# Patient Record
Sex: Female | Born: 1965 | Race: White | Hispanic: No | Marital: Single | Smoking: Former smoker
Health system: Southern US, Community
[De-identification: ages and names within clinical notes are randomized; demographics above are authoritative.]

## PROBLEM LIST (undated history)

## (undated) DIAGNOSIS — F32A Depression, unspecified: Secondary | ICD-10-CM

## (undated) DIAGNOSIS — I10 Essential (primary) hypertension: Secondary | ICD-10-CM

## (undated) DIAGNOSIS — F329 Major depressive disorder, single episode, unspecified: Secondary | ICD-10-CM

## (undated) DIAGNOSIS — F419 Anxiety disorder, unspecified: Secondary | ICD-10-CM

## (undated) DIAGNOSIS — G43909 Migraine, unspecified, not intractable, without status migrainosus: Secondary | ICD-10-CM

## (undated) DIAGNOSIS — R569 Unspecified convulsions: Secondary | ICD-10-CM

## (undated) HISTORY — DX: Anxiety disorder, unspecified: F41.9

## (undated) HISTORY — PX: INCISION AND DRAINAGE: SHX5863

## (undated) HISTORY — PX: EXAM UNDER ANESTHESIA WITH MANIPULATION OF SHOULDER: SHX5817

## (undated) HISTORY — DX: Depression, unspecified: F32.A

## (undated) HISTORY — DX: Migraine, unspecified, not intractable, without status migrainosus: G43.909

## (undated) HISTORY — PX: STAPEDES SURGERY: SHX789

## (undated) HISTORY — DX: Major depressive disorder, single episode, unspecified: F32.9

---

## 1999-03-13 ENCOUNTER — Emergency Department (HOSPITAL_COMMUNITY): Admission: EM | Admit: 1999-03-13 | Discharge: 1999-03-14 | Payer: Self-pay | Admitting: Emergency Medicine

## 1999-04-07 ENCOUNTER — Emergency Department (HOSPITAL_COMMUNITY): Admission: EM | Admit: 1999-04-07 | Discharge: 1999-04-07 | Payer: Self-pay | Admitting: *Deleted

## 1999-05-07 ENCOUNTER — Emergency Department (HOSPITAL_COMMUNITY): Admission: EM | Admit: 1999-05-07 | Discharge: 1999-05-07 | Payer: Self-pay | Admitting: Emergency Medicine

## 1999-10-06 ENCOUNTER — Emergency Department (HOSPITAL_COMMUNITY): Admission: EM | Admit: 1999-10-06 | Discharge: 1999-10-06 | Payer: Self-pay | Admitting: Internal Medicine

## 2000-01-05 ENCOUNTER — Encounter: Payer: Self-pay | Admitting: Family Medicine

## 2000-01-05 ENCOUNTER — Encounter: Admission: RE | Admit: 2000-01-05 | Discharge: 2000-01-05 | Payer: Self-pay | Admitting: Family Medicine

## 2012-09-15 DIAGNOSIS — E785 Hyperlipidemia, unspecified: Secondary | ICD-10-CM | POA: Insufficient documentation

## 2013-04-05 DIAGNOSIS — R4189 Other symptoms and signs involving cognitive functions and awareness: Secondary | ICD-10-CM | POA: Insufficient documentation

## 2013-04-05 DIAGNOSIS — I951 Orthostatic hypotension: Secondary | ICD-10-CM | POA: Insufficient documentation

## 2013-10-03 DIAGNOSIS — E669 Obesity, unspecified: Secondary | ICD-10-CM | POA: Insufficient documentation

## 2015-10-14 DIAGNOSIS — R51 Headache: Secondary | ICD-10-CM

## 2015-10-14 DIAGNOSIS — R519 Headache, unspecified: Secondary | ICD-10-CM | POA: Insufficient documentation

## 2015-12-13 ENCOUNTER — Ambulatory Visit (INDEPENDENT_AMBULATORY_CARE_PROVIDER_SITE_OTHER): Payer: BLUE CROSS/BLUE SHIELD | Admitting: Physician Assistant

## 2015-12-13 VITALS — BP 180/84 | HR 68 | Temp 97.8°F | Resp 17 | Ht 62.0 in | Wt 175.0 lb

## 2015-12-13 DIAGNOSIS — H9201 Otalgia, right ear: Secondary | ICD-10-CM

## 2015-12-13 DIAGNOSIS — M5430 Sciatica, unspecified side: Secondary | ICD-10-CM | POA: Insufficient documentation

## 2015-12-13 DIAGNOSIS — Z23 Encounter for immunization: Secondary | ICD-10-CM | POA: Diagnosis not present

## 2015-12-13 DIAGNOSIS — G40909 Epilepsy, unspecified, not intractable, without status epilepticus: Secondary | ICD-10-CM | POA: Insufficient documentation

## 2015-12-13 DIAGNOSIS — G43909 Migraine, unspecified, not intractable, without status migrainosus: Secondary | ICD-10-CM | POA: Insufficient documentation

## 2015-12-13 DIAGNOSIS — H6981 Other specified disorders of Eustachian tube, right ear: Secondary | ICD-10-CM

## 2015-12-13 DIAGNOSIS — F419 Anxiety disorder, unspecified: Secondary | ICD-10-CM | POA: Insufficient documentation

## 2015-12-13 DIAGNOSIS — R591 Generalized enlarged lymph nodes: Secondary | ICD-10-CM

## 2015-12-13 DIAGNOSIS — I10 Essential (primary) hypertension: Secondary | ICD-10-CM | POA: Insufficient documentation

## 2015-12-13 NOTE — Patient Instructions (Signed)
     IF you received an x-ray today, you will receive an invoice from [facility]. Please contact [facility] at [phone] with questions or concerns regarding your invoice.   IF you received labwork today, you will receive an invoice from [facility]. Please contact [facility] at [phone] with questions or concerns regarding your invoice.   Our billing staff will not be able to assist you with questions regarding bills from these companies.  You will be contacted with the lab results as soon as they are available. The fastest way to get your results is to activate your My Chart account. Instructions are located on the last page of this paperwork. If you have not heard from us regarding the results in 2 weeks, please contact this office.      

## 2015-12-13 NOTE — Progress Notes (Signed)
   Jacqueline Pratt  MRN: [id number] DOB: [birth year]  Subjective:  Pt presents to clinic with right ear problems - pain is sharp and deep inside her ear and radiates into her right neck - she is having no hearing problems - she has had no change in her tinnitus or vertigo - she has terrible itching in her ear canal - she came into the clinic because she has had problems with her ears in the past and she does not want to wait.  She has no current cold symptoms.  A shower helps with the pain.  Review of Systems  Constitutional: Negative for chills and fever.  HENT: Positive for ear pain (right). Negative for congestion, ear discharge, rhinorrhea and sore throat.     Patient Active Problem List   Diagnosis Date Noted  . HTN (hypertension) 12/13/2015  . Migraines 12/13/2015  . Sciatic leg pain 12/13/2015  . Anxiety 12/13/2015  . Seizure disorder ([place]) 12/13/2015    No current outpatient prescriptions on file prior to visit.   No current facility-administered medications on file prior to visit.     Allergies  Allergen Reactions  . Statins Other (See Comments)    Diarrhea   . Tetracyclines & Related Nausea Only    Pt patients past, family and social history were reviewed and updated.  Objective:  BP (!) 180/84 (BP Location: Right Arm, Patient Position: Sitting, Cuff Size: Normal)   Pulse 68   Temp 97.8 F (36.6 C) (Oral)   Resp 17   Ht 5\' 2"  (1.575 m)   Wt 175 lb (79.4 kg)   LMP 10/13/2015   SpO2 99%   BMI 32.01 kg/m   Physical Exam  Constitutional: She is oriented to person, place, and time and well-developed, well-nourished, and in no distress.  HENT:  Head: Normocephalic and atraumatic.  Right Ear: Hearing, external ear and ear canal normal. Tympanic membrane is scarred (posterior aspect) and bulging. A middle ear effusion (clear) is present.  Left Ear: Hearing, tympanic membrane, external ear and ear canal normal.  Nose: Nose normal.  Mouth/Throat: Uvula is  midline, oropharynx is clear and moist and mucous membranes are normal.  Eyes: Conjunctivae are normal.  Neck: Normal range of motion.  Cardiovascular: Normal rate, regular rhythm and normal heart sounds.   No murmur heard. Pulmonary/Chest: Effort normal and breath sounds normal.  Lymphadenopathy:       Head (right side): Tonsillar (tender) adenopathy present.       Head (left side): No tonsillar adenopathy present.  Neurological: She is alert and oriented to person, place, and time. Gait normal.  Skin: Skin is warm and dry.  Psychiatric: Mood, memory, affect and judgment normal.  Vitals reviewed.   Assessment and Plan :  Right ear pain  Need for prophylactic vaccination and inoculation against influenza - Plan: Flu Vaccine QUAD 36+ mos IM  Dysfunction of right eustachian tube  Lymphadenopathy   Pt's pain is likely multifactorial from the ETD and the swollen lymph node.  She has flonase at home that she will start using - she will use tylenol/motrin to help with the lymph node pain - if cold symptoms start she will start mucinex and other OTC medications to help with those symptoms.  Her questions were answered and she agrees with the plan.  Windell Hummingbird PA-C  [facility] 12/14/2015 8:07 AM

## 2016-01-08 ENCOUNTER — Ambulatory Visit: Payer: BLUE CROSS/BLUE SHIELD

## 2016-01-09 ENCOUNTER — Ambulatory Visit (INDEPENDENT_AMBULATORY_CARE_PROVIDER_SITE_OTHER): Payer: BLUE CROSS/BLUE SHIELD

## 2016-01-09 ENCOUNTER — Ambulatory Visit (INDEPENDENT_AMBULATORY_CARE_PROVIDER_SITE_OTHER): Payer: BLUE CROSS/BLUE SHIELD | Admitting: Physician Assistant

## 2016-01-09 ENCOUNTER — Encounter: Payer: Self-pay | Admitting: Physician Assistant

## 2016-01-09 VITALS — BP 118/72 | HR 74 | Temp 98.4°F | Resp 18 | Ht 62.0 in | Wt 184.0 lb

## 2016-01-09 DIAGNOSIS — S161XXA Strain of muscle, fascia and tendon at neck level, initial encounter: Secondary | ICD-10-CM | POA: Diagnosis not present

## 2016-01-09 DIAGNOSIS — M542 Cervicalgia: Secondary | ICD-10-CM

## 2016-01-09 MED ORDER — MELOXICAM 7.5 MG PO TABS: 7.5000 mg | ORAL_TABLET | Freq: Every day | ORAL | 1 refills | Status: DC

## 2016-01-09 MED ORDER — CYCLOBENZAPRINE HCL 10 MG PO TABS: 10.0000 mg | ORAL_TABLET | Freq: Three times a day (TID) | ORAL | 0 refills | Status: DC | PRN

## 2016-01-09 NOTE — Patient Instructions (Addendum)
There is no fracture of your neck. You have a muscle strain that will heal with rehab - including stretching and exercises. Patient is to perform exercises below at 5 sets with 10 repetitions. Stretches are to be performed for 5 sets, 10 seconds each. Recommended she perform this rehab twice daily within pain tolerance for 2 weeks.  Meloxicam is an antiinflammatory - you may take up to two a day as you see fit.    IF you received an x-ray today, you will receive an invoice from [facility]. Please contact [facility] at [phone] with questions or concerns regarding your invoice.   IF you received labwork today, you will receive an invoice from [employer]. Please contact [facility] at [phone] with questions or concerns regarding your invoice.   Our billing staff will not be able to assist you with questions regarding bills from these companies.  You will be contacted with the lab results as soon as they are available. The fastest way to get your results is to activate your My Chart account. Instructions are located on the last page of this paperwork. If you have not heard from [place] regarding the results in 2 weeks, please contact this office.     Cervical Strain and Sprain Rehab Ask your health care provider which exercises are safe for you. Do exercises exactly as told by your health care provider and adjust them as directed. It is normal to feel mild stretching, pulling, tightness, or discomfort as you do these exercises, but you should stop right away if you feel sudden pain or your pain gets worse.Do not begin these exercises until told by your health care provider. Stretching and range of motion exercises These exercises warm up your muscles and joints and improve the movement and flexibility of your neck. These exercises also help to relieve pain, numbness, and tingling. Exercise A: Cervical side bend 1. Using good posture, sit on a stable  chair or stand up. 2. Without moving your shoulders, slowly tilt your left / right ear to your shoulder until you feel a stretch in your neck muscles. You should be looking straight ahead. 3. Hold for __________ seconds. 4. Repeat with the other side of your neck. Repeat __________ times. Complete this exercise __________ times a day. Exercise B: Cervical rotation 1. Using good posture, sit on a stable chair or stand up. 2. Slowly turn your head to the side as if you are looking over your left / right shoulder.  Keep your eyes level with the ground.  Stop when you feel a stretch along the side and the back of your neck. 3. Hold for __________ seconds. 4. Repeat this by turning to your other side. Repeat __________ times. Complete this exercise __________ times a day. Exercise C: Thoracic extension and pectoral stretch 1. Roll a towel or a small blanket so it is about 4 inches (10 cm) in diameter. 2. Lie down on your back on a firm surface. 3. Put the towel lengthwise, under your spine in the middle of your back. It should not be not under your shoulder blades. The towel should line up with your spine from your middle back to your lower back. 4. Put your hands behind your head and let your elbows fall out to your sides. 5. Hold for __________ seconds. Repeat __________ times. Complete this exercise __________ times a day. Strengthening exercises These exercises build strength and endurance in your neck. Endurance is the ability to use your muscles  for a long time, even after your muscles get tired. Exercise D: Upper cervical flexion, isometric 1. Lie on your back with a thin pillow behind your head and a small rolled-up towel under your neck. 2. Gently tuck your chin toward your chest and nod your head down to look toward your feet. Do not lift your head off the pillow. 3. Hold for __________ seconds. 4. Release the tension slowly. Relax your neck muscles completely before you repeat this  exercise. Repeat __________ times. Complete this exercise __________ times a day. Exercise E: Cervical extension, isometric 1. Stand about 6 inches (15 cm) away from a wall, with your back facing the wall. 2. Place a soft object, about 6-8 inches (15-20 cm) in diameter, between the back of your head and the wall. A soft object could be a small pillow, a ball, or a folded towel. 3. Gently tilt your head back and press into the soft object. Keep your jaw and forehead relaxed. 4. Hold for __________ seconds. 5. Release the tension slowly. Relax your neck muscles completely before you repeat this exercise. Repeat __________ times. Complete this exercise __________ times a day. Posture and body mechanics   Body mechanics refers to the movements and positions of your body while you do your daily activities. Posture is part of body mechanics. Good posture and healthy body mechanics can help to relieve stress in your body's tissues and joints. Good posture means that your spine is in its natural S-curve position (your spine is neutral), your shoulders are pulled back slightly, and your head is not tipped forward. The following are general guidelines for applying improved posture and body mechanics to your everyday activities. Standing  When standing, keep your spine neutral and keep your feet about hip-width apart. Keep a slight bend in your knees. Your ears, shoulders, and hips should line up.  When you do a task in which you stand in one place for a long time, place one foot up on a stable object that is 2-4 inches (5-10 cm) high, such as a footstool. This helps keep your spine neutral. Sitting  When sitting, keep your spine neutral and your keep feet flat on the floor. Use a footrest, if necessary, and keep your thighs parallel to the floor. Avoid rounding your shoulders, and avoid tilting your head forward.  When working at a desk or a computer, keep your desk at a height where your hands are  slightly lower than your elbows. Slide your chair under your desk so you are close enough to maintain good posture.  When working at a computer, place your monitor at a height where you are looking straight ahead and you do not have to tilt your head forward or downward to look at the screen. Resting When lying down and resting, avoid positions that are most painful for you. Try to support your neck in a neutral position. You can use a contour pillow or a small rolled-up towel. Your pillow should support your neck but not push on it. This information is not intended to replace advice given to you by your health care provider. Make sure you discuss any questions you have with your health care provider. Document Released: 02/09/2005 Document Revised: 10/17/2015 Document Reviewed: 01/16/2015 Elsevier Interactive Patient Education  2017 [employer].
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
## 2016-02-04 ENCOUNTER — Other Ambulatory Visit: Payer: Self-pay

## 2016-02-04 DIAGNOSIS — M542 Cervicalgia: Secondary | ICD-10-CM

## 2016-02-04 MED ORDER — MELOXICAM 7.5 MG PO TABS: 7.5000 mg | ORAL_TABLET | Freq: Every day | ORAL | 0 refills | Status: DC

## 2016-02-04 NOTE — Telephone Encounter (Signed)
[facility] [address] Request Meloxicam req 90 day supply  Sent without refills.

## 2016-03-03 ENCOUNTER — Emergency Department (HOSPITAL_COMMUNITY)
Admission: EM | Admit: 2016-03-03 | Discharge: 2016-03-03 | Disposition: A | Discharge status: Home / Self Care | Payer: BLUE CROSS/BLUE SHIELD | Attending: Emergency Medicine | Admitting: Emergency Medicine

## 2016-03-03 ENCOUNTER — Ambulatory Visit (INDEPENDENT_AMBULATORY_CARE_PROVIDER_SITE_OTHER): Payer: BLUE CROSS/BLUE SHIELD | Admitting: Physician Assistant

## 2016-03-03 ENCOUNTER — Emergency Department (HOSPITAL_COMMUNITY): Payer: BLUE CROSS/BLUE SHIELD

## 2016-03-03 ENCOUNTER — Ambulatory Visit (INDEPENDENT_AMBULATORY_CARE_PROVIDER_SITE_OTHER): Payer: BLUE CROSS/BLUE SHIELD

## 2016-03-03 ENCOUNTER — Ambulatory Visit: Payer: BLUE CROSS/BLUE SHIELD

## 2016-03-03 ENCOUNTER — Encounter (HOSPITAL_COMMUNITY): Payer: Self-pay | Admitting: Emergency Medicine

## 2016-03-03 VITALS — BP 130/80 | HR 62 | Temp 97.7°F | Resp 17 | Ht 63.0 in | Wt 186.0 lb

## 2016-03-03 DIAGNOSIS — M25511 Pain in right shoulder: Secondary | ICD-10-CM | POA: Diagnosis not present

## 2016-03-03 DIAGNOSIS — W1830XA Fall on same level, unspecified, initial encounter: Secondary | ICD-10-CM | POA: Insufficient documentation

## 2016-03-03 DIAGNOSIS — S4991XA Unspecified injury of right shoulder and upper arm, initial encounter: Secondary | ICD-10-CM | POA: Diagnosis present

## 2016-03-03 DIAGNOSIS — Y939 Activity, unspecified: Secondary | ICD-10-CM | POA: Insufficient documentation

## 2016-03-03 DIAGNOSIS — S43014A Anterior dislocation of right humerus, initial encounter: Secondary | ICD-10-CM | POA: Insufficient documentation

## 2016-03-03 DIAGNOSIS — Y999 Unspecified external cause status: Secondary | ICD-10-CM | POA: Insufficient documentation

## 2016-03-03 DIAGNOSIS — I10 Essential (primary) hypertension: Secondary | ICD-10-CM | POA: Insufficient documentation

## 2016-03-03 DIAGNOSIS — S43004A Unspecified dislocation of right shoulder joint, initial encounter: Secondary | ICD-10-CM

## 2016-03-03 DIAGNOSIS — Y929 Unspecified place or not applicable: Secondary | ICD-10-CM | POA: Insufficient documentation

## 2016-03-03 HISTORY — DX: Unspecified convulsions: R56.9

## 2016-03-03 HISTORY — DX: Essential (primary) hypertension: I10

## 2016-03-03 MED ORDER — FENTANYL CITRATE (PF) 100 MCG/2ML IJ SOLN
25.0000 ug | Freq: Once | INTRAMUSCULAR | Status: AC
  Administered 2016-03-03: 25 ug via INTRAVENOUS
  Filled 2016-03-03: qty 2

## 2016-03-03 MED ORDER — PROPOFOL 10 MG/ML IV BOLUS
INTRAVENOUS | Status: AC | PRN
  Administered 2016-03-03: 20 mg via INTRAVENOUS
  Administered 2016-03-03: 42.2 mg via INTRAVENOUS
  Administered 2016-03-03: 20 mg via INTRAVENOUS

## 2016-03-03 MED ORDER — PROPOFOL 10 MG/ML IV BOLUS
0.5000 mg/kg | Freq: Once | INTRAVENOUS | Status: AC
Start: 2016-03-03 — End: 2016-03-03
  Administered 2016-03-03: 42.2 mg via INTRAVENOUS
  Filled 2016-03-03: qty 20

## 2016-03-03 NOTE — Progress Notes (Signed)
Attempted to reduce anteriorly displaced shoulder.  Injected shoulder with lidocaine to attempt to reduce pain in joint.    Procedure:  Injection of right shoulder Consent obtained and verified. Time-out conducted. Noted no overlying erythema, induration, or other signs of local infection. Skin prepped in a sterile fashion. Topical analgesic spray: Ethyl chloride. Completed without difficulty. Meds: 6 cc 2% xylocaine Pain immediately improved suggesting accurate placement of the medication. Advised to call if fevers/chills, erythema, induration, drainage, or persistent bleeding.  RUE neurovascularly intact pre and postreduction.   Able to reduce.  Postreduction attempt x-rays performed and confirmed.  Pt sent to ED to be evaluated by orthopedics.

## 2016-03-03 NOTE — ED Triage Notes (Signed)
Pt here with right shoulder dislocation; pt sent here for relocation

## 2016-03-03 NOTE — ED Provider Notes (Signed)
[place] DEPT Provider Note   CSN: [id number] Arrival date & time: 03/03/16  1529  By signing my name below, I, Jacqueline Pratt, attest that this documentation has been prepared under the direction and in the presence of Jacqueline Muskrat, MD. Electronically Signed: Soijett Pratt, ED Scribe. 03/03/16. 4:45 PM.  History   Chief Complaint Chief Complaint  Patient presents with  . Shoulder Injury    HPI Jacqueline Pratt is a 51 y.o. female with a PMHx of HTN, who presents to the Emergency Department complaining of right shoulder injury occurring this morning. Pt notes that she fell prior to the onset of her symptoms. Pt was evaluated at urgent care and was informed to follow up in the ED fr further evaluation. She was given a lidocaine injection while at Urgent Care with relief of her symptoms. She denies CP, LOC, confusion, hip pain, and any other symptoms. Multiple attempts to reduce the shoulder at urgent care were unsuccessful, and she was sent here for evaluation.   The history is provided by the patient. No language interpreter was used.    Past Medical History:  Diagnosis Date  . Hypertension   . Seizures [facility])     Patient Active Problem List   Diagnosis Date Noted  . HTN (hypertension) 12/13/2015  . Migraines 12/13/2015  . Sciatic leg pain 12/13/2015  . Anxiety 12/13/2015  . Seizure disorder ([place]) 12/13/2015    History reviewed. No pertinent surgical history.  OB History    No data available       Home Medications    Prior to Admission medications   Medication Sig Start Date End Date Taking? Authorizing Provider  amLODipine (NORVASC) 5 MG tablet Take 5 mg by mouth daily.    Historical Provider, MD  Cholecalciferol (VITAMIN D PO) Take by mouth daily.    Historical Provider, MD  clonazePAM (KLONOPIN) 0.5 MG tablet Take 0.5 mg by mouth 2 (two) times daily as needed for anxiety (PRN).    Historical Provider, MD  fluticasone (FLONASE) 50 MCG/ACT nasal spray Place  into the nose.    Historical Provider, MD  lamoTRIgine (LAMICTAL) 100 MG tablet Take 100 mg by mouth 2 (two) times daily.    Historical Provider, MD  levonorgestrel (MIRENA) 20 MCG/24HR IUD by Intrauterine route.    Historical Provider, MD  meloxicam (MOBIC) 7.5 MG tablet Take 1 tablet (7.5 mg total) by mouth daily. 02/04/16   Jacqueline Mink McVey, PA-C  Multiple Vitamins-Minerals (MULTIVITAMIN WITH MINERALS) tablet Take 1 tablet by mouth daily.    Historical Provider, MD  Riboflavin 400 MG CAPS Take 400 mg daily 04/08/15   Historical Provider, MD    Family History History reviewed. No pertinent family history.  Social History Social History  Substance Use Topics  . Smoking status: Never Smoker  . Smokeless tobacco: Never Used  . Alcohol use 4.2 oz/week    7 Standard drinks or equivalent per week     Allergies   Statins and Tetracyclines & related   Review of Systems Review of Systems  Constitutional:       Per HPI, otherwise negative  HENT:       Per HPI, otherwise negative  Respiratory:       Per HPI, otherwise negative  Cardiovascular:       Per HPI, otherwise negative  Gastrointestinal: Negative for vomiting.  Endocrine:       Negative aside from HPI  Genitourinary:       Neg aside from HPI  Musculoskeletal:       Per HPI, otherwise negative  Skin: Negative.   Neurological: Negative for syncope.     Physical Exam Updated Vital Signs BP 144/81 (BP Location: Right Arm)   Pulse 66   Temp 98.7 F (37.1 C) (Oral)   Resp 18   SpO2 100%   Physical Exam  Constitutional: She is oriented to person, place, and time. She appears well-developed and well-nourished. No distress.  HENT:  Head: Normocephalic and atraumatic.  Eyes: Conjunctivae and EOM are normal.  Cardiovascular: Normal rate and regular rhythm.   Pulmonary/Chest: Effort normal and breath sounds normal. No stridor. No respiratory distress.  Abdominal: She exhibits no distension.  Musculoskeletal:  She exhibits tenderness and deformity. She exhibits no edema.       Right shoulder: She exhibits decreased range of motion and deformity.  Right shoulder deformity with limited ROM  Neurological: She is alert and oriented to person, place, and time. No cranial nerve deficit.  Skin: Skin is warm and dry.  Psychiatric: She has a normal mood and affect.  Nursing note and vitals reviewed.    ED Treatments / Results  DIAGNOSTIC STUDIES: Oxygen Saturation is 100% on RA, nl by my interpretation.    COORDINATION OF CARE: 4:24 PM Discussed treatment plan with pt at bedside which includes right shoulder reduction, right shoulder xray, pain medication Rx, referral and follow up with orthopedist, and pt agreed to plan.  Radiology Dg Shoulder Right  Result Date: 03/03/2016 CLINICAL DATA:  Post reduction EXAM: RIGHT SHOULDER - 2+ VIEW COMPARISON:  03/03/2016 FINDINGS: Satisfactory shoulder reduction.  Hill-Sachs deformity. IMPRESSION: Satisfactory shoulder reduction. Electronically Signed   By: Franchot Gallo M.D.   On: 03/03/2016 19:10   Dg Shoulder Right  Result Date: 03/03/2016 CLINICAL DATA:  Attempted relocation of anterior dislocation. EXAM: RIGHT SHOULDER - 2+ VIEW COMPARISON:  None. FINDINGS: Anterior shoulder dislocation remains. There is no definite fracture. The visualized hemithorax is clear. The clavicle is unremarkable. IMPRESSION: Persistent anterior shoulder dislocation. Electronically Signed   By: San Morelle M.D.   On: 03/03/2016 14:35   Dg Shoulder Right  Result Date: 03/03/2016 CLINICAL DATA:  Rt Shoulder injury from a fall x this morning. EXAM: RIGHT SHOULDER - 2+ VIEW COMPARISON:  None. FINDINGS: Anterior dislocation of the glenohumeral joint with inferior subluxation. No definite fracture. Normal mineralization. No significant osseous degenerative change. AC joint appears intact. IMPRESSION: Anterior shoulder dislocation Electronically Signed   By: Lucrezia Europe M.D.   On:  03/03/2016 13:00    Procedures Reduction of dislocation Date/Time: 03/03/2016 5:41 PM Performed by: Jacqueline Pratt Authorized by: Jacqueline Pratt  Consent: Verbal consent obtained. Written consent obtained. Risks and benefits: risks, benefits and alternatives were discussed Consent given by: patient Patient understanding: patient states understanding of the procedure being performed Patient consent: the patient's understanding of the procedure matches consent given Procedure consent: procedure consent matches procedure scheduled Relevant documents: relevant documents present and verified Test results: test results available and properly labeled Site marked: the operative site was marked Imaging studies: imaging studies available Required items: required blood products, implants, devices, and special equipment available Patient identity confirmed: verbally with patient, arm band and hospital-assigned identification number Time out: Immediately prior to procedure a "time out" was called to verify the correct patient, procedure, equipment, support staff and site/side marked as required. Preparation: Patient was prepped and draped in the usual sterile fashion. Local anesthesia used: no  Anesthesia: Local anesthesia used: no  Sedation: Patient sedated:  yes Sedatives: propofol (82.2 ml) Analgesia: fentanyl (25 mcg) Sedation start date/time: 03/03/2016 5:41 PM Sedation end date/time: 03/03/2016 5:54 PM Vitals: Vital signs were monitored during sedation. Patient tolerance: Patient tolerated the procedure well with no immediate complications    (including critical care time)  Medications Ordered in ED Medications  propofol (DIPRIVAN) 10 mg/mL bolus/IV push (20 mg Intravenous Given 03/03/16 1747)  propofol (DIPRIVAN) 10 mg/mL bolus/IV push 42.2 mg (42.2 mg Intravenous Given 03/03/16 1717)  fentaNYL (SUBLIMAZE) injection 25 mcg (25 mcg Intravenous Given 03/03/16 1726)     Initial  Impression / Assessment and Plan / ED Course  I have reviewed the triage vital signs and the nursing notes.  Pertinent imaging results that were available during my care of the patient were reviewed by me and considered in my medical decision making (see chart for details).  Clinical Course     On repeat exam, 1930, patient is awake and alert, states that she feels substantially better, has no ongoing complaints. We discussed all findings and I demonstrated the x-rays to her and her mother. Patient will follow up with orthopedics. Given successful reduction of her shoulder dislocation, and no consultations from procedural sedation, nor reduction itself, patient appropriate for discharge with outpatient follow-up.  Final Clinical Impressions(s) / ED Diagnoses   Final diagnoses:  Dislocation of right shoulder joint, initial encounter    I personally performed the services described in this documentation, which was scribed in my presence. The recorded information has been reviewed and is accurate.       Jacqueline Muskrat, MD 03/03/16 [phone]

## 2016-03-03 NOTE — Sedation Documentation (Signed)
Shoulder reduction successful. MD Applied Sling.

## 2016-03-03 NOTE — Patient Instructions (Addendum)
Please go to [facility] ER right now for shoulder relocation.  Thank you for letting me participate in your health and well being.    IF you received an x-ray today, you will receive an invoice from [facility]. Please contact [facility] at [phone] with questions or concerns regarding your invoice.   IF you received labwork today, you will receive an invoice from [facility]. Please contact [facility] at [phone] with questions or concerns regarding your invoice.   Our billing staff will not be able to assist you with questions regarding bills from these companies.  You will be contacted with the lab results as soon as they are available. The fastest way to get your results is to activate your My Chart account. Instructions are located on the last page of this paperwork. If you have not heard from [place] regarding the results in 2 weeks, please contact this office.

## 2016-03-03 NOTE — Progress Notes (Signed)
[name]  MRN: [id number] DOB: 1965-12-17  Subjective:  Jacqueline Pratt is a 51 y.o. female seen in office today for a chief complaint of right shoulder pain x 2 hours prior to arrival. She slipped and fell and hit right shoulder. Notes she can move the arm but it hurts to do so. Denies loss of sensation, numbness, and tingling. Of note, pt has history of broken right arm.   Review of Systems  Constitutional: Negative for chills, diaphoresis, fatigue and fever.  Musculoskeletal: Negative for back pain, gait problem and neck pain.  Neurological: Negative for dizziness, light-headedness and headaches.    Patient Active Problem List   Diagnosis Date Noted  . HTN (hypertension) 12/13/2015  . Migraines 12/13/2015  . Sciatic leg pain 12/13/2015  . Anxiety 12/13/2015  . Seizure disorder ([place]) 12/13/2015    Current Outpatient Prescriptions on File Prior to Visit  Medication Sig Dispense Refill  . amLODipine (NORVASC) 5 MG tablet Take 5 mg by mouth daily.    . Cholecalciferol (VITAMIN D PO) Take by mouth daily.    . clonazePAM (KLONOPIN) 0.5 MG tablet Take 0.5 mg by mouth 2 (two) times daily as needed for anxiety (PRN).    Marland Kitchen lamoTRIgine (LAMICTAL) 100 MG tablet Take 100 mg by mouth 2 (two) times daily.    . meloxicam (MOBIC) 7.5 MG tablet Take 1 tablet (7.5 mg total) by mouth daily. 90 tablet 0  . Multiple Vitamins-Minerals (MULTIVITAMIN WITH MINERALS) tablet Take 1 tablet by mouth daily.     No current facility-administered medications on file prior to visit.     Allergies  Allergen Reactions  . Statins Other (See Comments)    Diarrhea   . Tetracyclines & Related Nausea Only       Social History   Social History  . Marital status: Single    Spouse name: N/A  . Number of children: N/A  . Years of education: N/A   Occupational History  . Not on file.   Social History Main Topics  . Smoking status: Never Smoker  . Smokeless tobacco: Never Used  . Alcohol use  4.2 oz/week    7 Standard drinks or equivalent per week  . Drug use: No  . Sexual activity: No   Other Topics Concern  . Not on file   Social History Narrative  . No narrative on file     Objective:  BP 130/80 (BP Location: Right Arm, Patient Position: Sitting, Cuff Size: Normal)   Pulse 62   Temp 97.7 F (36.5 C) (Oral)   Resp 17   Ht 5\' 3"  (1.6 m)   Wt 186 lb (84.4 kg)   SpO2 98%   BMI 32.95 kg/m   Physical Exam  Constitutional: She is oriented to person, place, and time. She appears distressed (in pain).  HENT:  Head: Normocephalic and atraumatic.  Eyes: Conjunctivae are normal.  Neck: Normal range of motion.  Cardiovascular:  Pulses:      Radial pulses are 2+ on the right side, and 2+ on the left side.  Pulmonary/Chest: Effort normal.  Musculoskeletal:       Right shoulder: She exhibits decreased range of motion, tenderness, bony tenderness (exquisite pain with palpation over anterior aspect of shoulder), swelling, deformity and decreased strength.  Neurological: She is alert and oriented to person, place, and time. Gait normal.  Skin: Skin is dry.  Pt is wearing dark nail polish but can appreciate cap refill at edge of nails bilaterally.  Posterior aspect of right wrist, lower forearm, and elbow are cool to palpation. Pulses intact. No pain or pallor noted.   Psychiatric: Affect normal.  Vitals reviewed.  Dg Shoulder Right  Result Date: 03/03/2016 CLINICAL DATA:  Attempted relocation of anterior dislocation. EXAM: RIGHT SHOULDER - 2+ VIEW COMPARISON:  None. FINDINGS: Anterior shoulder dislocation remains. There is no definite fracture. The visualized hemithorax is clear. The clavicle is unremarkable. IMPRESSION: Persistent anterior shoulder dislocation. Electronically Signed   By: San Morelle M.D.   On: 03/03/2016 14:35   Dg Shoulder Right  Result Date: 03/03/2016 CLINICAL DATA:  Rt Shoulder injury from a fall x this morning. EXAM: RIGHT SHOULDER - 2+  VIEW COMPARISON:  None. FINDINGS: Anterior dislocation of the glenohumeral joint with inferior subluxation. No definite fracture. Normal mineralization. No significant osseous degenerative change. AC joint appears intact. IMPRESSION: Anterior shoulder dislocation Electronically Signed   By: Lucrezia Europe M.D.   On: 03/03/2016 13:00   Multiple attempts of reduction of shoulder dislocation were attempted without success.   Assessment and Plan :  1. Acute pain of right shoulder - DG Shoulder Right; Future - DG Shoulder Right; Future  2. Anterior shoulder dislocation, right, initial encounter -Attempted reduction of shoulder dislocation multiple times without success. Pt instructed to go directly to [facility] ED for further treatment. Pt would like to be transported by her mother who drove her here.  [facility] ED triage nurse, [place], contacted and informed that patient would be arriving soon.    Tenna Delaine PA-C  [facility] 03/03/2016 4:34 PM

## 2016-03-04 ENCOUNTER — Other Ambulatory Visit: Payer: Self-pay | Admitting: Physician Assistant

## 2016-03-04 DIAGNOSIS — M542 Cervicalgia: Secondary | ICD-10-CM

## 2016-03-05 ENCOUNTER — Ambulatory Visit (INDEPENDENT_AMBULATORY_CARE_PROVIDER_SITE_OTHER): Payer: BLUE CROSS/BLUE SHIELD | Admitting: Orthopedic Surgery

## 2016-03-05 ENCOUNTER — Encounter (INDEPENDENT_AMBULATORY_CARE_PROVIDER_SITE_OTHER): Payer: Self-pay | Admitting: Orthopedic Surgery

## 2016-03-05 DIAGNOSIS — S43014A Anterior dislocation of right humerus, initial encounter: Secondary | ICD-10-CM | POA: Diagnosis not present

## 2016-03-05 NOTE — Progress Notes (Signed)
Office Visit Note   Patient: Jacqueline Pratt           Date of Birth: 1965-08-02           MRN: [id number] Visit Date: 03/05/2016 Requested by: No referring provider defined for this encounter. PCP: No PCP Per Patient  Subjective: Chief Complaint  Patient presents with  . Right Shoulder - Pain, Injury, Dislocation    HPI Kerins a 51 year old female who dislocated her shoulder 2 days ago.  She required reduction with sedation in the emergency room.  Dislocated 1 time after it was relocated and then had to be relocated again.  She is right-hand dominant.  She works at a call center.  She enjoys doing yoga.              Review of Systems All systems reviewed are negative as they relate to the chief complaint within the history of present illness.  Patient denies  fevers or chills.    Assessment & Plan: Visit Diagnoses:  1. Dislocation, shoulder, anterior, right, initial encounter     Plan: Impression is right shoulder dislocation with no evidence of rotator cuff tear and good deltoid strength.  Plan rehabilitation exercises plus sling for a week plus no overhead motion for 4 weeks total.  Recheck clinically in 4 weeks.  At this point her chance of redislocation is on the order of 20-25%.  I think doing some cuff strengthening exercises would be helpful.  We'll see her back in 4 weeks to decide than for against MRI scanning.  May need to consider surgical stabilization if she redislocates the shoulder again.  Follow-Up Instructions: Return in about 4 weeks (around 04/02/2016).   Orders:  No orders of the defined types were placed in this encounter.  No orders of the defined types were placed in this encounter.     Procedures: No procedures performed   Clinical Data: No additional findings.  Objective: Vital Signs: There were no vitals taken for this visit.  Physical Exam   Constitutional: Patient appears well-developed HEENT:  Head: Normocephalic Eyes:EOM show  strabismus Neck: Normal range of motion Cardiovascular: Normal rate Pulmonary/chest: Effort normal Neurologic: Patient is alert Skin: Skin is warm Psychiatric: Patient has normal mood and affect    Ortho Exam examination of the right shoulder demonstrates good rotator cuff strength isolated and status x-rays subscap testing on the right.  Deltoid does fire.  I will detect much in the way of course grinding or crepitus with passive range of motion of the shoulder.  I did not take the shoulder through a large range of motion because of her history.  Specialty Comments:  No specialty comments available.  Imaging: No results found.   PMFS History: Patient Active Problem List   Diagnosis Date Noted  . Dislocation, shoulder, anterior, right, initial encounter 03/05/2016  . HTN (hypertension) 12/13/2015  . Migraines 12/13/2015  . Sciatic leg pain 12/13/2015  . Anxiety 12/13/2015  . Seizure disorder ([place]) 12/13/2015   Past Medical History:  Diagnosis Date  . Hypertension   . Seizures ([place])     No family history on file.  No past surgical history on file. Social History   Occupational History  . Not on file.   Social History Main Topics  . Smoking status: Never Smoker  . Smokeless tobacco: Never Used  . Alcohol use 4.2 oz/week    7 Standard drinks or equivalent per week  . Drug use: No  . Sexual activity: No

## 2016-04-10 ENCOUNTER — Telehealth (INDEPENDENT_AMBULATORY_CARE_PROVIDER_SITE_OTHER): Payer: Self-pay | Admitting: Orthopedic Surgery

## 2016-04-10 ENCOUNTER — Other Ambulatory Visit (INDEPENDENT_AMBULATORY_CARE_PROVIDER_SITE_OTHER): Payer: Self-pay | Admitting: Family

## 2016-04-10 MED ORDER — CYCLOBENZAPRINE HCL 10 MG PO TABS: 10.0000 mg | ORAL_TABLET | Freq: Three times a day (TID) | ORAL | 0 refills | Status: AC | PRN

## 2016-04-10 NOTE — Telephone Encounter (Signed)
Rx request Marlou Sa pt

## 2016-04-10 NOTE — Telephone Encounter (Signed)
Sent in an rx for flexeril

## 2016-04-10 NOTE — Telephone Encounter (Signed)
Pt requests refill flexeril for dislocated shoulder, today.  [phone]

## 2016-04-10 NOTE — Progress Notes (Unsigned)
fles

## 2016-04-13 ENCOUNTER — Ambulatory Visit (INDEPENDENT_AMBULATORY_CARE_PROVIDER_SITE_OTHER): Payer: BLUE CROSS/BLUE SHIELD | Admitting: Orthopedic Surgery

## 2016-04-16 ENCOUNTER — Encounter (INDEPENDENT_AMBULATORY_CARE_PROVIDER_SITE_OTHER): Payer: Self-pay | Admitting: Orthopedic Surgery

## 2016-04-16 ENCOUNTER — Ambulatory Visit (INDEPENDENT_AMBULATORY_CARE_PROVIDER_SITE_OTHER): Payer: BLUE CROSS/BLUE SHIELD | Admitting: Orthopedic Surgery

## 2016-04-16 DIAGNOSIS — S43001D Unspecified subluxation of right shoulder joint, subsequent encounter: Secondary | ICD-10-CM

## 2016-04-16 MED ORDER — MELOXICAM 7.5 MG PO TABS: 15.0000 mg | ORAL_TABLET | Freq: Every day | ORAL | 1 refills | Status: DC | PRN

## 2016-04-16 MED ORDER — METHOCARBAMOL 500 MG PO TABS: 500.0000 mg | ORAL_TABLET | Freq: Two times a day (BID) | ORAL | 0 refills | Status: DC | PRN

## 2016-04-16 NOTE — Progress Notes (Signed)
Office Visit Note   Patient: Jacqueline Pratt           Date of Birth: 05-31-1965           MRN: [id number] Visit Date: 04/16/2016 Requested by: No referring provider defined for this encounter. PCP: No PCP Per Patient  Subjective: Chief Complaint  Patient presents with  . Right Shoulder - Pain, Follow-up    HPI Jacqueline Pratt is a 51 year old patient here to follow-up dislocation right shoulder 03/03/2016.  Still has some soreness but in general she's doing well.  Has had no episodes of instability.  Driving her to some.  She has some occasional popping but no real symptomatic mechanical symptoms.  She denies any weakness.  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              Review of Systems All systems reviewed are negative as they relate to the chief complaint within the history of present illness.  Patient denies  fevers or chills.    Assessment & Plan: Visit Diagnoses:  1. Shoulder subluxation, right, subsequent encounter     Plan: Impression is right shoulder dislocation with no evidence of rotator cuff tearing or symptomatic instability.  On her refill her mobile and Flexeril.  Her strength is good.  I'll see her back as needed.  If she has recurrent instability we will proceed with scanning and fixation but at this point I think she is very functional with her shoulder  Follow-Up Instructions: Return if symptoms worsen or fail to improve.   Orders:  No orders of the defined types were placed in this encounter.  No orders of the defined types were placed in this encounter.     Procedures: No procedures performed   Clinical Data: No additional findings.  Objective: Vital Signs: There were no vitals taken for this visit.  Physical Exam   Constitutional: Patient appears well-developed HEENT:  Head: Normocephalic Eyes:EOM are normal Neck: Normal range of motion Cardiovascular: Normal rate Pulmonary/chest: Effort normal Neurologic:  Patient is alert Skin: Skin is warm Psychiatric: Patient has normal mood and affect    Ortho Exam examination of the right shoulder demonstrates excellent rotator cuff strength symmetric to the left-hand side to infraspinatus super space and subscap muscle testing.  Negative apprehension relocation testing.  Negative O'Brien's testing on the right and left.  No acromioclavicular joint tenderness on the right or left hand side.  No other masses lymph adenopathy or skin changes noted in the shoulder region.  No course grinding or crepitus with labral load testing on the right-hand side.  Specialty Comments:  No specialty comments available.  Imaging: No results found.   PMFS History: Patient Active Problem List   Diagnosis Date Noted  . Dislocation, shoulder, anterior, right, initial encounter 03/05/2016  . HTN (hypertension) 12/13/2015  . Migraines 12/13/2015  . Sciatic leg pain 12/13/2015  . Anxiety 12/13/2015  . Seizure disorder ([place]) 12/13/2015   Past Medical History:  Diagnosis Date  . Hypertension   . Seizures ([place])     No family history on file.  No past surgical history on file. Social History   Occupational History  . Not on file.   Social History Main Topics  . Smoking status: Never Smoker  . Smokeless tobacco: Never Used  . Alcohol use 4.2 oz/week    7 Standard drinks or equivalent per week  . Drug use: No  . Sexual activity: No

## 2016-05-01 ENCOUNTER — Other Ambulatory Visit: Payer: Self-pay | Admitting: Physician Assistant

## 2016-05-01 DIAGNOSIS — M542 Cervicalgia: Secondary | ICD-10-CM

## 2016-05-03 NOTE — Telephone Encounter (Signed)
04/22/16 last refill?

## 2016-05-26 ENCOUNTER — Telehealth (INDEPENDENT_AMBULATORY_CARE_PROVIDER_SITE_OTHER): Payer: Self-pay | Admitting: Orthopedic Surgery

## 2016-05-26 NOTE — Telephone Encounter (Signed)
I RECEIVED VM FROM RAVEN @ [facility] CHECKING STATUS OF REQUEST. I CALLED BACK [phone] AND ADVISED WE DO NOT HAVE REQUEST AND ASKED REQUEST BE RESENT.

## 2016-06-10 ENCOUNTER — Other Ambulatory Visit (INDEPENDENT_AMBULATORY_CARE_PROVIDER_SITE_OTHER): Payer: Self-pay | Admitting: Orthopedic Surgery

## 2016-06-10 NOTE — Telephone Encounter (Signed)
Rx request 

## 2016-08-07 ENCOUNTER — Other Ambulatory Visit (INDEPENDENT_AMBULATORY_CARE_PROVIDER_SITE_OTHER): Payer: Self-pay | Admitting: Orthopedic Surgery

## 2016-08-07 NOTE — Telephone Encounter (Signed)
Rx request 

## 2016-10-09 ENCOUNTER — Other Ambulatory Visit (INDEPENDENT_AMBULATORY_CARE_PROVIDER_SITE_OTHER): Payer: Self-pay | Admitting: Orthopedic Surgery

## 2016-10-09 NOTE — Telephone Encounter (Signed)
Ok to rf? 

## 2016-12-02 ENCOUNTER — Encounter: Payer: Self-pay | Admitting: Gastroenterology

## 2016-12-02 ENCOUNTER — Other Ambulatory Visit (INDEPENDENT_AMBULATORY_CARE_PROVIDER_SITE_OTHER): Payer: Self-pay | Admitting: Family

## 2017-01-20 ENCOUNTER — Telehealth: Payer: Self-pay | Admitting: *Deleted

## 2017-01-20 NOTE — Telephone Encounter (Signed)
Thanks for checking.  She is OK for [place] as scheduled.  - HD

## 2017-01-20 NOTE — Telephone Encounter (Signed)
Dr. Loletha Carrow,  When reviewing patient's chart for upcoming procedure on 02/08/17 as a direct colonoscopy, I saw where she has a history of seizures.  Her last documented one was on 05/20/16.  Is she okay to have direct colon or should she have an o.v. With you first.  Pease advise.  Thank you.    Elizabeth Palau, CMA-PV

## 2017-01-25 ENCOUNTER — Other Ambulatory Visit: Payer: Self-pay

## 2017-01-25 ENCOUNTER — Ambulatory Visit (AMBULATORY_SURGERY_CENTER): Payer: Self-pay | Admitting: *Deleted

## 2017-01-25 VITALS — Ht 62.0 in | Wt 190.0 lb

## 2017-01-25 DIAGNOSIS — Z1211 Encounter for screening for malignant neoplasm of colon: Secondary | ICD-10-CM

## 2017-01-25 MED ORDER — NA SULFATE-K SULFATE-MG SULF 17.5-3.13-1.6 GM/177ML PO SOLN: ORAL | 0 refills | Status: DC

## 2017-01-25 NOTE — Progress Notes (Signed)
Patient denies any allergies to eggs or soy. Patient denies any problems with anesthesia/sedation. Patient denies any oxygen use at home. Patient denies taking any diet/weight loss medications or blood thinners. EMMI education assisgned to patient on colonoscopy, this was explained and instructions given to patient. Patient states her last Seizure was 3 years ago.

## 2017-01-25 NOTE — Telephone Encounter (Signed)
Patient had pre-visit today. Patient states her last Seizure was "3 years ago".

## 2017-01-27 ENCOUNTER — Telehealth: Payer: Self-pay | Admitting: Gastroenterology

## 2017-01-27 NOTE — Telephone Encounter (Signed)
Patient states prep for colon requires prior auth.pt had pv 12.3.18.

## 2017-01-27 NOTE — Telephone Encounter (Signed)
Called patient, no answer, left message. Explained in message that we do not do PA for suprep.

## 2017-02-08 ENCOUNTER — Ambulatory Visit (AMBULATORY_SURGERY_CENTER): Payer: BLUE CROSS/BLUE SHIELD | Admitting: Gastroenterology

## 2017-02-08 ENCOUNTER — Encounter: Payer: Self-pay | Admitting: Gastroenterology

## 2017-02-08 ENCOUNTER — Other Ambulatory Visit: Payer: Self-pay

## 2017-02-08 VITALS — BP 115/57 | HR 50 | Temp 97.1°F | Resp 16 | Ht 62.0 in | Wt 190.0 lb

## 2017-02-08 DIAGNOSIS — D122 Benign neoplasm of ascending colon: Secondary | ICD-10-CM | POA: Diagnosis not present

## 2017-02-08 DIAGNOSIS — Z1212 Encounter for screening for malignant neoplasm of rectum: Secondary | ICD-10-CM

## 2017-02-08 DIAGNOSIS — Z1211 Encounter for screening for malignant neoplasm of colon: Secondary | ICD-10-CM | POA: Diagnosis present

## 2017-02-08 MED ORDER — SODIUM CHLORIDE 0.9 % IV SOLN: 500.0000 mL | Freq: Once | INTRAVENOUS | Status: DC

## 2017-02-08 NOTE — Progress Notes (Signed)
Called to room to assist during endoscopic procedure.  Patient ID and intended procedure confirmed with present staff. Received instructions for my participation in the procedure from the performing physician.  

## 2017-02-08 NOTE — Progress Notes (Signed)
Pt's states no medical or surgical changes since previsit or office visit. 

## 2017-02-08 NOTE — Patient Instructions (Signed)
YOU HAD AN ENDOSCOPIC PROCEDURE TODAY AT [facility]:   Refer to the procedure report that was given to you for any specific questions about what was found during the examination.  If the procedure report does not answer your questions, please call your gastroenterologist to clarify.  If you requested that your care partner not be given the details of your procedure findings, then the procedure report has been included in a sealed envelope for you to review at your convenience later.  YOU SHOULD EXPECT: Some feelings of bloating in the abdomen. Passage of more gas than usual.  Walking can help get rid of the air that was put into your GI tract during the procedure and reduce the bloating. If you had a lower endoscopy (such as a colonoscopy or flexible sigmoidoscopy) you may notice spotting of blood in your stool or on the toilet paper. If you underwent a bowel prep for your procedure, you may not have a normal bowel movement for a few days.  Please Note:  You might notice some irritation and congestion in your nose or some drainage.  This is from the oxygen used during your procedure.  There is no need for concern and it should clear up in a day or so.  SYMPTOMS TO REPORT IMMEDIATELY:   Following lower endoscopy (colonoscopy or flexible sigmoidoscopy):  Excessive amounts of blood in the stool  Significant tenderness or worsening of abdominal pains  Swelling of the abdomen that is new, acute  Fever of 100F or higher  Please see handouts given to you by your recovery nurse on Polyps.  For urgent or emergent issues, a gastroenterologist can be reached at any hour by calling [phone].   DIET:  We do recommend a small meal at first, but then you may proceed to your regular diet.  Drink plenty of fluids but you should avoid alcoholic beverages for 24 hours.  ACTIVITY:  You should plan to take it easy for the rest of today and you should NOT DRIVE or use heavy machinery until  tomorrow (because of the sedation medicines used during the test).    FOLLOW UP: Our staff will call the number listed on your records the next business day following your procedure to check on you and address any questions or concerns that you may have regarding the information given to you following your procedure. If we do not reach you, we will leave a message.  However, if you are feeling well and you are not experiencing any problems, there is no need to return our call.  We will assume that you have returned to your regular daily activities without incident.  If any biopsies were taken you will be contacted by phone or by letter within the next 1-3 weeks.  Please call us at [phone] if you have not heard about the biopsies in 3 weeks.    SIGNATURES/CONFIDENTIALITY: You and/or your care partner have signed paperwork which will be entered into your electronic medical record.  These signatures attest to the fact that that the information above on your After Visit Summary has been reviewed and is understood.  Full responsibility of the confidentiality of this discharge information lies with you and/or your care-partner.  Thank you for letting us take care of your healthcare needs today.

## 2017-02-08 NOTE — Op Note (Signed)
[place] Patient Name: Jacqueline Pratt Procedure Date: 02/08/2017 8:05 AM MRN: [id number] Endoscopist: Lorenzo. Loletha Carrow , MD Age: 51 Referring MD:  Date of Birth: 02/02/1966 Gender: Female Account #: [id number] Procedure:                Colonoscopy Indications:              Screening for colorectal malignant neoplasm, This                            is the patient's first colonoscopy Medicines:                Monitored Anesthesia Care Procedure:                Pre-Anesthesia Assessment:                           - Prior to the procedure, a History and Physical                            was performed, and patient medications and                            allergies were reviewed. The patient's tolerance of                            previous anesthesia was also reviewed. The risks                            and benefits of the procedure and the sedation                            options and risks were discussed with the patient.                            All questions were answered, and informed consent                            was obtained. Prior Anticoagulants: The patient has                            taken no previous anticoagulant or antiplatelet                            agents. ASA Grade Assessment: II - A patient with                            mild systemic disease. After reviewing the risks                            and benefits, the patient was deemed in                            satisfactory condition to undergo the procedure.  After obtaining informed consent, the colonoscope                            was passed under direct vision. Throughout the                            procedure, the patient's blood pressure, pulse, and                            oxygen saturations were monitored continuously. The                            Colonoscope was introduced through the anus and                            advanced to the the  cecum, identified by                            appendiceal orifice and ileocecal valve. The                            colonoscopy was performed without difficulty. The                            patient tolerated the procedure well. The quality                            of the bowel preparation was excellent. The                            ileocecal valve, appendiceal orifice, and rectum                            were photographed. The quality of the bowel                            preparation was evaluated using the BBPS [facility]                            Bowel Preparation Scale) with scores of: Right                            Colon = 3, Transverse Colon = 3 and Left Colon = 3                            (entire mucosa seen well with no residual staining,                            small fragments of stool or opaque liquid). The                            total BBPS score equals 9. The bowel preparation  used was SUPREP. Scope In: 8:05:42 AM Scope Out: 8:18:58 AM Scope Withdrawal Time: 0 hours 10 minutes 49 seconds  Total Procedure Duration: 0 hours 13 minutes 16 seconds  Findings:                 The perianal and digital rectal examinations were                            normal.                           Two sessile polyps were found in the proximal                            ascending colon. The polyps were 2 mm in size.                            These polyps were removed with a cold biopsy                            forceps. Resection and retrieval were complete.                           The exam was otherwise without abnormality on                            direct and retroflexion views. Complications:            No immediate complications. Estimated Blood Loss:     Estimated blood loss was minimal. Impression:               - Two 2 mm polyps in the proximal ascending colon,                            removed with a cold biopsy forceps. Resected  and                            retrieved.                           - The examination was otherwise normal on direct                            and retroflexion views. Recommendation:           - Patient has a contact number available for                            emergencies. The signs and symptoms of potential                            delayed complications were discussed with the                            patient. Return to normal activities tomorrow.  Written discharge instructions were provided to the                            patient.                           - Resume previous diet.                           - Continue present medications.                           - Await pathology results.                           - Repeat colonoscopy is recommended for                            surveillance. The colonoscopy date will be                            determined after pathology results from today's                            exam become available for review. Ashton Belote L. Loletha Carrow, MD 02/08/2017 8:21:42 AM This report has been signed electronically.

## 2017-02-08 NOTE — Progress Notes (Signed)
Report given to PACU, vss 

## 2017-02-09 ENCOUNTER — Telehealth: Payer: Self-pay | Admitting: *Deleted

## 2017-02-09 ENCOUNTER — Telehealth: Payer: Self-pay

## 2017-02-09 NOTE — Telephone Encounter (Signed)
No answer, message left for the patient. 

## 2017-02-09 NOTE — Telephone Encounter (Signed)
Left message on answering machine. 

## 2017-02-11 ENCOUNTER — Other Ambulatory Visit (INDEPENDENT_AMBULATORY_CARE_PROVIDER_SITE_OTHER): Payer: Self-pay | Admitting: Family

## 2017-02-12 ENCOUNTER — Encounter: Payer: Self-pay | Admitting: Gastroenterology

## 2017-03-16 ENCOUNTER — Other Ambulatory Visit (INDEPENDENT_AMBULATORY_CARE_PROVIDER_SITE_OTHER): Payer: Self-pay | Admitting: Radiology

## 2017-03-17 MED ORDER — MELOXICAM 7.5 MG PO TABS: ORAL_TABLET | ORAL | 2 refills | Status: AC

## 2018-02-26 ENCOUNTER — Inpatient Hospital Stay (HOSPITAL_COMMUNITY)
Admission: EM | Admit: 2018-02-26 | Discharge: 2018-03-26 | DRG: 064 | Disposition: E | Payer: Managed Care, Other (non HMO) | Attending: Neurology | Admitting: Neurology

## 2018-02-26 ENCOUNTER — Emergency Department (HOSPITAL_COMMUNITY): Payer: Managed Care, Other (non HMO)

## 2018-02-26 DIAGNOSIS — Z87891 Personal history of nicotine dependence: Secondary | ICD-10-CM

## 2018-02-26 DIAGNOSIS — E041 Nontoxic single thyroid nodule: Secondary | ICD-10-CM | POA: Diagnosis present

## 2018-02-26 DIAGNOSIS — R7989 Other specified abnormal findings of blood chemistry: Secondary | ICD-10-CM | POA: Diagnosis not present

## 2018-02-26 DIAGNOSIS — Z881 Allergy status to other antibiotic agents status: Secondary | ICD-10-CM

## 2018-02-26 DIAGNOSIS — J189 Pneumonia, unspecified organism: Secondary | ICD-10-CM | POA: Diagnosis not present

## 2018-02-26 DIAGNOSIS — Z515 Encounter for palliative care: Secondary | ICD-10-CM | POA: Diagnosis not present

## 2018-02-26 DIAGNOSIS — F1290 Cannabis use, unspecified, uncomplicated: Secondary | ICD-10-CM | POA: Diagnosis present

## 2018-02-26 DIAGNOSIS — Z886 Allergy status to analgesic agent status: Secondary | ICD-10-CM

## 2018-02-26 DIAGNOSIS — Z9911 Dependence on respirator [ventilator] status: Secondary | ICD-10-CM | POA: Diagnosis not present

## 2018-02-26 DIAGNOSIS — F329 Major depressive disorder, single episode, unspecified: Secondary | ICD-10-CM | POA: Diagnosis present

## 2018-02-26 DIAGNOSIS — D649 Anemia, unspecified: Secondary | ICD-10-CM | POA: Diagnosis present

## 2018-02-26 DIAGNOSIS — J15211 Pneumonia due to Methicillin susceptible Staphylococcus aureus: Secondary | ICD-10-CM | POA: Diagnosis not present

## 2018-02-26 DIAGNOSIS — G936 Cerebral edema: Secondary | ICD-10-CM | POA: Diagnosis present

## 2018-02-26 DIAGNOSIS — Z0189 Encounter for other specified special examinations: Secondary | ICD-10-CM

## 2018-02-26 DIAGNOSIS — G935 Compression of brain: Secondary | ICD-10-CM | POA: Diagnosis present

## 2018-02-26 DIAGNOSIS — I615 Nontraumatic intracerebral hemorrhage, intraventricular: Secondary | ICD-10-CM | POA: Diagnosis present

## 2018-02-26 DIAGNOSIS — I671 Cerebral aneurysm, nonruptured: Secondary | ICD-10-CM | POA: Diagnosis present

## 2018-02-26 DIAGNOSIS — Z87898 Personal history of other specified conditions: Secondary | ICD-10-CM | POA: Diagnosis not present

## 2018-02-26 DIAGNOSIS — J9690 Respiratory failure, unspecified, unspecified whether with hypoxia or hypercapnia: Secondary | ICD-10-CM

## 2018-02-26 DIAGNOSIS — E669 Obesity, unspecified: Secondary | ICD-10-CM | POA: Diagnosis present

## 2018-02-26 DIAGNOSIS — E785 Hyperlipidemia, unspecified: Secondary | ICD-10-CM | POA: Diagnosis not present

## 2018-02-26 DIAGNOSIS — G9341 Metabolic encephalopathy: Secondary | ICD-10-CM | POA: Diagnosis present

## 2018-02-26 DIAGNOSIS — E872 Acidosis: Secondary | ICD-10-CM | POA: Diagnosis present

## 2018-02-26 DIAGNOSIS — Z888 Allergy status to other drugs, medicaments and biological substances status: Secondary | ICD-10-CM

## 2018-02-26 DIAGNOSIS — F1010 Alcohol abuse, uncomplicated: Secondary | ICD-10-CM | POA: Diagnosis present

## 2018-02-26 DIAGNOSIS — R0602 Shortness of breath: Secondary | ICD-10-CM | POA: Diagnosis not present

## 2018-02-26 DIAGNOSIS — I161 Hypertensive emergency: Secondary | ICD-10-CM | POA: Diagnosis present

## 2018-02-26 DIAGNOSIS — Z66 Do not resuscitate: Secondary | ICD-10-CM | POA: Diagnosis present

## 2018-02-26 DIAGNOSIS — I610 Nontraumatic intracerebral hemorrhage in hemisphere, subcortical: Principal | ICD-10-CM | POA: Diagnosis present

## 2018-02-26 DIAGNOSIS — F419 Anxiety disorder, unspecified: Secondary | ICD-10-CM | POA: Diagnosis present

## 2018-02-26 DIAGNOSIS — R34 Anuria and oliguria: Secondary | ICD-10-CM | POA: Diagnosis present

## 2018-02-26 DIAGNOSIS — R569 Unspecified convulsions: Secondary | ICD-10-CM | POA: Diagnosis present

## 2018-02-26 DIAGNOSIS — M6282 Rhabdomyolysis: Secondary | ICD-10-CM | POA: Diagnosis present

## 2018-02-26 DIAGNOSIS — I672 Cerebral atherosclerosis: Secondary | ICD-10-CM | POA: Diagnosis present

## 2018-02-26 DIAGNOSIS — J690 Pneumonitis due to inhalation of food and vomit: Secondary | ICD-10-CM | POA: Diagnosis not present

## 2018-02-26 DIAGNOSIS — E876 Hypokalemia: Secondary | ICD-10-CM | POA: Diagnosis present

## 2018-02-26 DIAGNOSIS — E870 Hyperosmolality and hypernatremia: Secondary | ICD-10-CM | POA: Diagnosis present

## 2018-02-26 DIAGNOSIS — J9811 Atelectasis: Secondary | ICD-10-CM | POA: Diagnosis not present

## 2018-02-26 DIAGNOSIS — J9601 Acute respiratory failure with hypoxia: Secondary | ICD-10-CM | POA: Diagnosis present

## 2018-02-26 DIAGNOSIS — G8194 Hemiplegia, unspecified affecting left nondominant side: Secondary | ICD-10-CM | POA: Diagnosis present

## 2018-02-26 DIAGNOSIS — I619 Nontraumatic intracerebral hemorrhage, unspecified: Secondary | ICD-10-CM | POA: Diagnosis present

## 2018-02-26 DIAGNOSIS — I119 Hypertensive heart disease without heart failure: Secondary | ICD-10-CM | POA: Diagnosis present

## 2018-02-26 DIAGNOSIS — E782 Mixed hyperlipidemia: Secondary | ICD-10-CM | POA: Diagnosis not present

## 2018-02-26 DIAGNOSIS — I10 Essential (primary) hypertension: Secondary | ICD-10-CM | POA: Diagnosis not present

## 2018-02-26 DIAGNOSIS — J181 Lobar pneumonia, unspecified organism: Secondary | ICD-10-CM | POA: Diagnosis not present

## 2018-02-26 DIAGNOSIS — Z6836 Body mass index (BMI) 36.0-36.9, adult: Secondary | ICD-10-CM

## 2018-02-26 DIAGNOSIS — I361 Nonrheumatic tricuspid (valve) insufficiency: Secondary | ICD-10-CM | POA: Diagnosis not present

## 2018-02-26 DIAGNOSIS — Z8249 Family history of ischemic heart disease and other diseases of the circulatory system: Secondary | ICD-10-CM

## 2018-02-26 DIAGNOSIS — N179 Acute kidney failure, unspecified: Secondary | ICD-10-CM | POA: Diagnosis present

## 2018-02-26 DIAGNOSIS — D72829 Elevated white blood cell count, unspecified: Secondary | ICD-10-CM | POA: Diagnosis not present

## 2018-02-26 DIAGNOSIS — Z713 Dietary counseling and surveillance: Secondary | ICD-10-CM

## 2018-02-26 DIAGNOSIS — I611 Nontraumatic intracerebral hemorrhage in hemisphere, cortical: Secondary | ICD-10-CM | POA: Diagnosis not present

## 2018-02-26 DIAGNOSIS — R509 Fever, unspecified: Secondary | ICD-10-CM | POA: Diagnosis not present

## 2018-02-26 DIAGNOSIS — J9600 Acute respiratory failure, unspecified whether with hypoxia or hypercapnia: Secondary | ICD-10-CM

## 2018-02-26 DIAGNOSIS — I351 Nonrheumatic aortic (valve) insufficiency: Secondary | ICD-10-CM | POA: Diagnosis not present

## 2018-02-26 LAB — RAPID URINE DRUG SCREEN, HOSP PERFORMED
Amphetamines: NOT DETECTED
Barbiturates: NOT DETECTED
Benzodiazepines: NOT DETECTED
Cocaine: NOT DETECTED
Opiates: NOT DETECTED
Tetrahydrocannabinol: NOT DETECTED

## 2018-02-26 LAB — COMPREHENSIVE METABOLIC PANEL
ALT: 50 U/L — AB (ref 0–44)
AST: 81 U/L — ABNORMAL HIGH (ref 15–41)
Albumin: 4.6 g/dL (ref 3.5–5.0)
Alkaline Phosphatase: 90 U/L (ref 38–126)
Anion gap: 16 — ABNORMAL HIGH (ref 5–15)
BUN: 12 mg/dL (ref 6–20)
CO2: 20 mmol/L — ABNORMAL LOW (ref 22–32)
CREATININE: 1.18 mg/dL — AB (ref 0.44–1.00)
Calcium: 9.3 mg/dL (ref 8.9–10.3)
Chloride: 98 mmol/L (ref 98–111)
GFR calc Af Amer: >60 mL/min (ref >60)
GFR calc non Af Amer: 53 mL/min — ABNORMAL LOW (ref >60)
Glucose, Bld: 267 mg/dL — ABNORMAL HIGH (ref 70–99)
Potassium: 3.4 mmol/L — ABNORMAL LOW (ref 3.5–5.1)
Sodium: 134 mmol/L — ABNORMAL LOW (ref 135–145)
Total Bilirubin: 0.7 mg/dL (ref 0.3–1.2)
Total Protein: 8.2 g/dL — ABNORMAL HIGH (ref 6.5–8.1)

## 2018-02-26 LAB — I-STAT CHEM 8, ED
BUN: 13 mg/dL (ref 6–20)
Calcium, Ion: 1.19 mmol/L (ref 1.15–1.40)
Chloride: 99 mmol/L (ref 98–111)
Creatinine, Ser: 0.9 mg/dL (ref 0.44–1.00)
Glucose, Bld: 244 mg/dL — ABNORMAL HIGH (ref 70–99)
HCT: 52 % — ABNORMAL HIGH (ref 36.0–46.0)
Hemoglobin: 17.7 g/dL — ABNORMAL HIGH (ref 12.0–15.0)
Potassium: 3.3 mmol/L — ABNORMAL LOW (ref 3.5–5.1)
Sodium: 135 mmol/L (ref 135–145)
TCO2: 24 mmol/L (ref 22–32)

## 2018-02-26 LAB — URINALYSIS, ROUTINE W REFLEX MICROSCOPIC
Bacteria, UA: NONE SEEN
Bilirubin Urine: NEGATIVE
Glucose, UA: 150 mg/dL — AB
Ketones, ur: 5 mg/dL — AB
Leukocytes, UA: NEGATIVE
Nitrite: NEGATIVE
Protein, ur: 100 mg/dL — AB
Specific Gravity, Urine: 1.012 (ref 1.005–1.030)
pH: 7 (ref 5.0–8.0)

## 2018-02-26 LAB — I-STAT VENOUS BLOOD GAS, ED
Acid-base deficit: 5 mmol/L — ABNORMAL HIGH (ref 0.0–2.0)
Bicarbonate: 24.8 mmol/L (ref 20.0–28.0)
O2 Saturation: 65 %
PCO2 VEN: 62 mmHg — AB (ref 44.0–60.0)
TCO2: 27 mmol/L (ref 22–32)
pH, Ven: 7.209 — ABNORMAL LOW (ref 7.250–7.430)
pO2, Ven: 42 mmHg (ref 32.0–45.0)

## 2018-02-26 LAB — I-STAT BETA HCG BLOOD, ED (MC, WL, AP ONLY)

## 2018-02-26 LAB — PROTIME-INR
INR: 0.99
Prothrombin Time: 13 seconds (ref 11.4–15.2)

## 2018-02-26 LAB — CBC
HCT: 50.9 % — ABNORMAL HIGH (ref 36.0–46.0)
Hemoglobin: 16.5 g/dL — ABNORMAL HIGH (ref 12.0–15.0)
MCH: 29.6 pg (ref 26.0–34.0)
MCHC: 32.4 g/dL (ref 30.0–36.0)
MCV: 91.2 fL (ref 80.0–100.0)
Platelets: 305 10*3/uL (ref 150–400)
RBC: 5.58 MIL/uL — ABNORMAL HIGH (ref 3.87–5.11)
RDW: 12.2 % (ref 11.5–15.5)
WBC: 25.5 10*3/uL — ABNORMAL HIGH (ref 4.0–10.5)
nRBC: 0 % (ref 0.0–0.2)

## 2018-02-26 LAB — GLUCOSE, CAPILLARY: Glucose-Capillary: 132 mg/dL — ABNORMAL HIGH (ref 70–99)

## 2018-02-26 LAB — DIFFERENTIAL
Abs Immature Granulocytes: 0.24 10*3/uL — ABNORMAL HIGH (ref 0.00–0.07)
BASOS ABS: 0.1 10*3/uL (ref 0.0–0.1)
Basophils Relative: 1 %
Eosinophils Absolute: 0 10*3/uL (ref 0.0–0.5)
Eosinophils Relative: 0 %
Immature Granulocytes: 1 %
Lymphocytes Relative: 11 %
Lymphs Abs: 2.7 10*3/uL (ref 0.7–4.0)
MONO ABS: 1 10*3/uL (ref 0.1–1.0)
Monocytes Relative: 4 %
Neutro Abs: 21.5 10*3/uL — ABNORMAL HIGH (ref 1.7–7.7)
Neutrophils Relative %: 83 %

## 2018-02-26 LAB — LACTIC ACID, PLASMA: Lactic Acid, Venous: 4.2 mmol/L (ref 0.5–1.9)

## 2018-02-26 LAB — I-STAT TROPONIN, ED: Troponin i, poc: 0.3 ng/mL (ref 0.00–0.08)

## 2018-02-26 LAB — SODIUM: Sodium: 135 mmol/L (ref 135–145)

## 2018-02-26 LAB — ETHANOL: Alcohol, Ethyl (B): <10 mg/dL (ref <10)

## 2018-02-26 LAB — CK: Total CK: 2884 U/L — ABNORMAL HIGH (ref 38–234)

## 2018-02-26 LAB — APTT: aPTT: 24 seconds (ref 24–36)

## 2018-02-26 MED ORDER — PROPOFOL 1000 MG/100ML IV EMUL
INTRAVENOUS | Status: AC
  Filled 2018-02-26: qty 100

## 2018-02-26 MED ORDER — FOLIC ACID 1 MG PO TABS
1.0000 mg | ORAL_TABLET | Freq: Every day | ORAL | Status: DC
  Administered 2018-02-27: 1 mg via ORAL
  Filled 2018-02-26: qty 1

## 2018-02-26 MED ORDER — VITAMIN B-1 100 MG PO TABS
100.0000 mg | ORAL_TABLET | Freq: Every day | ORAL | Status: DC
  Administered 2018-02-27 – 2018-03-07 (×9): 100 mg via ORAL
  Filled 2018-02-26 (×9): qty 1

## 2018-02-26 MED ORDER — ACETAMINOPHEN 160 MG/5ML PO SOLN
650.0000 mg | ORAL | Status: DC | PRN
  Administered 2018-02-27 – 2018-03-07 (×9): 650 mg
  Filled 2018-02-26 (×10): qty 20.3

## 2018-02-26 MED ORDER — PANTOPRAZOLE SODIUM 40 MG PO PACK
40.0000 mg | PACK | Freq: Every day | ORAL | Status: DC
  Administered 2018-02-27 – 2018-03-07 (×9): 40 mg
  Filled 2018-02-26 (×10): qty 20

## 2018-02-26 MED ORDER — DOCUSATE SODIUM 50 MG/5ML PO LIQD
100.0000 mg | Freq: Two times a day (BID) | ORAL | Status: DC | PRN
  Administered 2018-03-01 – 2018-03-04 (×2): 100 mg
  Filled 2018-02-26 (×2): qty 10

## 2018-02-26 MED ORDER — ORAL CARE MOUTH RINSE
15.0000 mL | OROMUCOSAL | Status: DC
  Administered 2018-02-26 – 2018-03-09 (×99): 15 mL via OROMUCOSAL

## 2018-02-26 MED ORDER — SENNOSIDES-DOCUSATE SODIUM 8.6-50 MG PO TABS
1.0000 | ORAL_TABLET | Freq: Two times a day (BID) | ORAL | Status: DC
  Administered 2018-02-27 (×2): 1 via ORAL
  Filled 2018-02-26 (×2): qty 1

## 2018-02-26 MED ORDER — CHLORHEXIDINE GLUCONATE 0.12% ORAL RINSE (MEDLINE KIT)
15.0000 mL | Freq: Two times a day (BID) | OROMUCOSAL | Status: DC
  Administered 2018-02-26 – 2018-03-08 (×21): 15 mL via OROMUCOSAL

## 2018-02-26 MED ORDER — LORAZEPAM 2 MG/ML IJ SOLN
1.0000 mg | Freq: Four times a day (QID) | INTRAMUSCULAR | Status: DC
  Administered 2018-02-28 – 2018-03-01 (×2): 1 mg via INTRAVENOUS
  Filled 2018-02-26 (×2): qty 1

## 2018-02-26 MED ORDER — STROKE: EARLY STAGES OF RECOVERY BOOK
Freq: Once | Status: AC
  Administered 2018-02-27: 09:00:00

## 2018-02-26 MED ORDER — ACETAMINOPHEN 325 MG PO TABS: 650.0000 mg | ORAL_TABLET | ORAL | Status: DC | PRN

## 2018-02-26 MED ORDER — LORAZEPAM 2 MG/ML IJ SOLN
2.0000 mg | Freq: Four times a day (QID) | INTRAMUSCULAR | Status: AC
  Administered 2018-02-26 – 2018-02-27 (×2): 2 mg via INTRAVENOUS
  Filled 2018-02-26 (×2): qty 1

## 2018-02-26 MED ORDER — ETOMIDATE 2 MG/ML IV SOLN
INTRAVENOUS | Status: AC | PRN
  Administered 2018-02-26: 20 mg via INTRAVENOUS

## 2018-02-26 MED ORDER — SODIUM CHLORIDE 3 % IV SOLN
INTRAVENOUS | Status: DC
  Administered 2018-02-26 – 2018-02-27 (×2): 50 mL/h via INTRAVENOUS
  Administered 2018-02-27: 75 mL/h via INTRAVENOUS
  Filled 2018-02-26 (×4): qty 500

## 2018-02-26 MED ORDER — ACETAMINOPHEN 650 MG RE SUPP
650.0000 mg | RECTAL | Status: DC | PRN
  Administered 2018-03-05: 650 mg via RECTAL
  Filled 2018-02-26: qty 1

## 2018-02-26 MED ORDER — CLEVIDIPINE BUTYRATE 0.5 MG/ML IV EMUL
0.0000 mg/h | INTRAVENOUS | Status: DC
  Administered 2018-02-26: 1 mg/h via INTRAVENOUS
  Administered 2018-02-27: 11 mg/h via INTRAVENOUS
  Administered 2018-02-27: 9 mg/h via INTRAVENOUS
  Administered 2018-02-27: 8 mg/h via INTRAVENOUS
  Administered 2018-02-27: 11 mg/h via INTRAVENOUS
  Administered 2018-02-27: 8 mg/h via INTRAVENOUS
  Administered 2018-02-28: 6 mg/h via INTRAVENOUS
  Administered 2018-02-28: 1 mg/h via INTRAVENOUS
  Administered 2018-02-28: 4 mg/h via INTRAVENOUS
  Administered 2018-03-01: 5 mg/h via INTRAVENOUS
  Administered 2018-03-02 (×2): 8 mg/h via INTRAVENOUS
  Administered 2018-03-02: 6 mg/h via INTRAVENOUS
  Administered 2018-03-02: 2 mg/h via INTRAVENOUS
  Administered 2018-03-03: 6 mg/h via INTRAVENOUS
  Administered 2018-03-03: 5 mg/h via INTRAVENOUS
  Administered 2018-03-03 (×3): 4 mg/h via INTRAVENOUS
  Administered 2018-03-04: 6 mg/h via INTRAVENOUS
  Filled 2018-02-26 (×2): qty 50
  Filled 2018-02-26: qty 100
  Filled 2018-02-26 (×13): qty 50
  Filled 2018-02-26: qty 100
  Filled 2018-02-26 (×3): qty 50

## 2018-02-26 MED ORDER — SODIUM CHLORIDE 0.9 % IV SOLN
INTRAVENOUS | Status: DC | PRN
  Administered 2018-02-26 – 2018-03-05 (×3): 250 mL via INTRAVENOUS

## 2018-02-26 MED ORDER — PROPOFOL 1000 MG/100ML IV EMUL
INTRAVENOUS | Status: AC | PRN
  Administered 2018-02-26: 60 ug/kg/min via INTRAVENOUS

## 2018-02-26 MED ORDER — FENTANYL CITRATE (PF) 100 MCG/2ML IJ SOLN
100.0000 ug | INTRAMUSCULAR | Status: DC | PRN
  Administered 2018-03-06 – 2018-03-07 (×3): 100 ug via INTRAVENOUS
  Filled 2018-02-26 (×5): qty 2

## 2018-02-26 MED ORDER — ROCURONIUM BROMIDE 50 MG/5ML IV SOLN
INTRAVENOUS | Status: AC | PRN
  Administered 2018-02-26: 100 mg via INTRAVENOUS

## 2018-02-26 MED ORDER — INSULIN ASPART 100 UNIT/ML ~~LOC~~ SOLN
0.0000 [IU] | Freq: Three times a day (TID) | SUBCUTANEOUS | Status: DC
  Administered 2018-02-27 – 2018-03-01 (×5): 2 [IU] via SUBCUTANEOUS
  Administered 2018-03-02 – 2018-03-03 (×2): 3 [IU] via SUBCUTANEOUS
  Administered 2018-03-03 – 2018-03-04 (×4): 2 [IU] via SUBCUTANEOUS

## 2018-02-26 MED ORDER — LORAZEPAM BOLUS VIA INFUSION: 1.0000 mg | Freq: Four times a day (QID) | INTRAVENOUS | Status: DC

## 2018-02-26 MED ORDER — PROPOFOL 1000 MG/100ML IV EMUL
5.0000 ug/kg/min | INTRAVENOUS | Status: DC
  Administered 2018-02-27: 30 ug/kg/min via INTRAVENOUS

## 2018-02-26 MED ORDER — LEVETIRACETAM IN NACL 1000 MG/100ML IV SOLN
1000.0000 mg | Freq: Two times a day (BID) | INTRAVENOUS | Status: DC
  Administered 2018-02-26 – 2018-03-05 (×14): 1000 mg via INTRAVENOUS
  Filled 2018-02-26 (×14): qty 100

## 2018-02-26 MED ORDER — LORAZEPAM BOLUS VIA INFUSION: 2.0000 mg | Freq: Four times a day (QID) | INTRAVENOUS | Status: DC

## 2018-02-26 MED ORDER — FENTANYL CITRATE (PF) 100 MCG/2ML IJ SOLN
100.0000 ug | INTRAMUSCULAR | Status: DC | PRN
  Administered 2018-03-08: 100 ug via INTRAVENOUS
  Filled 2018-02-26: qty 2

## 2018-02-26 NOTE — Consult Note (Signed)
Reason for Consult:ICH Referring Physician: [name] is an 53 y.o. female.   HPI: Per EMS- pt here from home, was found in a bathtub face up. She was unresponsive and required assisted ventilations with a BVM. Pt now on non rebreather. Hx of ETOH abuse and seizures. No hx of drug abuse. No meds given. CBG 225. 20G PIV to right hand. Pt responds to painful stimuli.  Jacqueline L Buchananis a 53 y.o.femalewith PMH of HTN, seizure, and migraine HA presents to the [facility] after being found unresponsive in a empty bathtub. She was incontinent of stool on scene and had labored respirations.EMS state that family found the patient but she has an unknown last seen normal time.They had to provide assist ventilations in the field but deny any vomiting.Patient began to breathe spontaneously and follow commands in route.According to family on scene the patient has been dealing with alcohol problems and the patient does have a history of seizure.Patient is unable to provide any meaningful history. She will follow commands but speech is abnormal.   Patient intubated in ER, on precedex.  Exam per Dr. Cheral Marker from Neurology (patient is not following commands.  She has some spontaneous movement of right upper extremity with extensor posturing of left side.  Her pupils are equal, round and reactive to light without anisocoria).     Past Medical History:  Diagnosis Date  . Anxiety   . Depression   . Hypertension   . Migraines   . Seizures (Jacqueline Pratt)    last seizure 3 years ago per pt    Past Surgical History:  Procedure Laterality Date  . EXAM UNDER ANESTHESIA WITH MANIPULATION OF SHOULDER    . INCISION AND DRAINAGE     left lower side- staph infection  . STAPEDES SURGERY      Family History  Problem Relation Age of Onset  . Diabetes Mother   . Non-Hodgkin's lymphoma Sister   . Rectal cancer Maternal Grandfather   . Hypertension Maternal Grandfather   . Diabetes Maternal  Grandfather   . Colon cancer Neg Hx     Social History:  reports that she has quit smoking. She has never used smokeless tobacco. She reports current alcohol use of about 14.0 standard drinks of alcohol per week. She reports that she does not use drugs.  Allergies:  Allergies  Allergen Reactions  . Aspirin Other (See Comments) and Nausea And Vomiting  . Lovastatin Diarrhea  . Simvastatin Diarrhea  . Statins Other (See Comments)    Diarrhea   . Tetracyclines & Related Nausea Only    Medications: I have reviewed patient's medications  Results for orders placed or performed during the hospital encounter of 03/18/2018 (from the past 48 hour(s))  I-stat troponin, ED     Status: Abnormal   Collection Time: 03/25/2018  5:14 PM  Result Value Ref Range   Troponin i, poc 0.30 (HH) 0.00 - 0.08 ng/mL   Comment NOTIFIED PHYSICIAN    Comment 3            Comment: Due to the release kinetics of cTnI, a negative result within the first hours of the onset of symptoms does not rule out myocardial infarction with certainty. If myocardial infarction is still suspected, repeat the test at appropriate intervals.   I-Stat beta hCG blood, ED     Status: None   Collection Time: 03/16/2018  5:15 PM  Result Value Ref Range   I-stat hCG, quantitative <5.0 <5 mIU/mL  Comment 3            Comment:   GEST. AGE      CONC.  (mIU/mL)   <=1 WEEK        5 - 50     2 WEEKS       50 - 500     3 WEEKS       100 - 10,000     4 WEEKS     1,000 - 30,000        FEMALE AND NON-PREGNANT FEMALE:     LESS THAN 5 mIU/mL   I-Stat Chem 8, ED     Status: Abnormal   Collection Time: 03/05/2018  5:16 PM  Result Value Ref Range   Sodium 135 135 - 145 mmol/L   Potassium 3.3 (L) 3.5 - 5.1 mmol/L   Chloride 99 98 - 111 mmol/L   BUN 13 6 - 20 mg/dL   Creatinine, Ser 0.90 0.44 - 1.00 mg/dL   Glucose, Bld 244 (H) 70 - 99 mg/dL   Calcium, Ion 1.19 1.15 - 1.40 mmol/L   TCO2 24 22 - 32 mmol/L   Hemoglobin 17.7 (H) 12.0 - 15.0  g/dL   HCT 52.0 (H) 36.0 - 46.0 %  Ethanol     Status: None   Collection Time: 02/24/2018  5:16 PM  Result Value Ref Range   Alcohol, Ethyl (B) <10 <10 mg/dL    Comment: (NOTE) Lowest detectable limit for serum alcohol is 10 mg/dL. For medical purposes only. Performed at [facility], [place] [address] [place], [place] [address]   Protime-INR     Status: None   Collection Time: 03/14/2018  5:16 PM  Result Value Ref Range   Prothrombin Time 13.0 11.4 - 15.2 seconds   INR 0.99     Comment: Performed at [facility] [address] [place], [place] [address]  APTT     Status: None   Collection Time: 03/08/2018  5:16 PM  Result Value Ref Range   aPTT 24 24 - 36 seconds    Comment: Performed at [facility] [address], [place], [place] [address]  CBC     Status: Abnormal   Collection Time: 03/03/2018  5:16 PM  Result Value Ref Range   WBC 25.5 (H) 4.0 - 10.5 K/uL   RBC 5.58 (H) 3.87 - 5.11 MIL/uL   Hemoglobin 16.5 (H) 12.0 - 15.0 g/dL   HCT 50.9 (H) 36.0 - 46.0 %   MCV 91.2 80.0 - 100.0 fL   MCH 29.6 26.0 - 34.0 pg   MCHC 32.4 30.0 - 36.0 g/dL   RDW 12.2 11.5 - 15.5 %   Platelets 305 150 - 400 K/uL   nRBC 0.0 0.0 - 0.2 %    Comment: Performed at [facility], [place] [address], [place],  [address]  Differential     Status: Abnormal   Collection Time: 03/25/2018  5:16 PM  Result Value Ref Range   Neutrophils Relative % 83 %   Neutro Abs 21.5 (H) 1.7 - 7.7 K/uL   Lymphocytes Relative 11 %   Lymphs Abs 2.7 0.7 - 4.0 K/uL   Monocytes Relative 4 %   Monocytes Absolute 1.0 0.1 - 1.0 K/uL   Eosinophils Relative 0 %   Eosinophils Absolute 0.0 0.0 - 0.5 K/uL   Basophils Relative 1 %   Basophils Absolute 0.1 0.0 - 0.1 K/uL   RBC Morphology MORPHOLOGY UNREMARKABLE  Immature Granulocytes 1 %   Abs Immature Granulocytes 0.24 (H) 0.00 - 0.07 K/uL    Comment: Performed at [facility], [place] [address] [place], [place] [address]  Comprehensive metabolic  panel     Status: Abnormal   Collection Time: 03/16/2018  5:16 PM  Result Value Ref Range   Sodium 134 (L) 135 - 145 mmol/L   Potassium 3.4 (L) 3.5 - 5.1 mmol/L   Chloride 98 98 - 111 mmol/L   CO2 20 (L) 22 - 32 mmol/L   Glucose, Bld 267 (H) 70 - 99 mg/dL   BUN 12 6 - 20 mg/dL   Creatinine, Ser 1.18 (H) 0.44 - 1.00 mg/dL   Calcium 9.3 8.9 - 10.3 mg/dL   Total Protein 8.2 (H) 6.5 - 8.1 g/dL   Albumin 4.6 3.5 - 5.0 g/dL   AST 81 (H) 15 - 41 U/L   ALT 50 (H) 0 - 44 U/L   Alkaline Phosphatase 90 38 - 126 U/L   Total Bilirubin 0.7 0.3 - 1.2 mg/dL   GFR calc non Af Amer 53 (L) >60 mL/min   GFR calc Af Amer >60 >60 mL/min   Anion gap 16 (H) 5 - 15    Comment: Performed at [facility], [place] [address], [place], [place] [address]  CK     Status: Abnormal   Collection Time: 02/27/2018  5:16 PM  Result Value Ref Range   Total CK 2,884 (H) 38 - 234 U/L    Comment: Performed at [facility], [place] [address], [place], [place] [address]  I-Stat venous blood gas, ED     Status: Abnormal   Collection Time: 03/17/2018  5:16 PM  Result Value Ref Range   pH, Ven 7.209 (L) 7.250 - 7.430   pCO2, Ven 62.0 (H) 44.0 - 60.0 mmHg   pO2, Ven 42.0 32.0 - 45.0 mmHg   Bicarbonate 24.8 20.0 - 28.0 mmol/L   TCO2 27 22 - 32 mmol/L   O2 Saturation 65.0 %   Acid-base deficit 5.0 (H) 0.0 - 2.0 mmol/L   Patient temperature HIDE    Sample type VENOUS     Ct Head Wo Contrast  Result Date: 03/04/2018 CLINICAL DATA:  53 y/o F; altered level of consciousness. Found unresponsive in a bathtub. EXAM: CT HEAD WITHOUT CONTRAST CT CERVICAL SPINE WITHOUT CONTRAST TECHNIQUE: Multidetector CT imaging of the head and cervical spine was performed following the standard protocol without intravenous contrast. Multiplanar CT image reconstructions of the cervical spine were also generated. COMPARISON:  01/09/2016 cervical spine radiographs FINDINGS: CT HEAD FINDINGS Brain: Acute hemorrhage centered in the right basal ganglia  with extension into the right cerebral hemisphere measuring 8.4 x 3.9 x 4.6 cm (volume = 79 cm^3) (AP x ML x CC series 4, image 24 and series 6, image 38). There is extension of the hematoma into the intraventricular system, predominantly the right lateral ventricle. Associated edema and mass effect results in 10 mm of right-to-left midline shift, right-sided uncal herniation, and partial effacement of the basilar cisterns. No transtentorial or foramen magnum herniation. Vascular: No hyperdense vessel or unexpected calcification. Skull: Normal. Negative for fracture or focal lesion. Sinuses/Orbits: Small paranasal sinus fluid levels. Normal aeration of mastoid air cells. Orbits are unremarkable. Other: None. CT CERVICAL SPINE FINDINGS Alignment: Normal. Skull base and vertebrae: No acute fracture. No primary bone lesion or focal pathologic process. Soft tissues and spinal canal: No prevertebral fluid or swelling. No visible canal  hematoma. Disc levels: Mild spondylosis of the cervical spine with predominantly discogenic degenerative changes greatest at the C5-C7 levels. Upper chest: Negative. Other: 15 mm nodule within the left lobe of the thyroid gland. IMPRESSION: CT head: 1. Acute hemorrhage centered in right basal ganglia with extension into right cerebral hemisphere measuring up to 8.4 cm, 79 CC. 2. Extension of hemorrhage into the intraventricular system, greatest in the right lateral ventricle. 3. Mass effect with 10 mm right-to-left midline shift, right-sided uncal herniation, and partial effacement of basilar cisterns. No downward herniation at this time. CT cervical spine. 1. No acute fracture or dislocation of the cervical spine. 2. 15 mm nodule within left lobe of thyroid gland. Further evaluation with thyroid ultrasound is recommended on a nonemergent basis. 3. Mild spondylosis of the cervical spine. Critical Value/emergent results were called by telephone at the time of interpretation on 03/15/2018 at  5:55 pm to Dr. Nanda Quinton , who verbally acknowledged these results. Electronically Signed   By: Kristine Garbe M.D.   On: 03/02/2018 18:00   Ct Cervical Spine Wo Contrast  Result Date: 03/10/2018 CLINICAL DATA:  53 y/o F; altered level of consciousness. Found unresponsive in a bathtub. EXAM: CT HEAD WITHOUT CONTRAST CT CERVICAL SPINE WITHOUT CONTRAST TECHNIQUE: Multidetector CT imaging of the head and cervical spine was performed following the standard protocol without intravenous contrast. Multiplanar CT image reconstructions of the cervical spine were also generated. COMPARISON:  01/09/2016 cervical spine radiographs FINDINGS: CT HEAD FINDINGS Brain: Acute hemorrhage centered in the right basal ganglia with extension into the right cerebral hemisphere measuring 8.4 x 3.9 x 4.6 cm (volume = 79 cm^3) (AP x ML x CC series 4, image 24 and series 6, image 38). There is extension of the hematoma into the intraventricular system, predominantly the right lateral ventricle. Associated edema and mass effect results in 10 mm of right-to-left midline shift, right-sided uncal herniation, and partial effacement of the basilar cisterns. No transtentorial or foramen magnum herniation. Vascular: No hyperdense vessel or unexpected calcification. Skull: Normal. Negative for fracture or focal lesion. Sinuses/Orbits: Small paranasal sinus fluid levels. Normal aeration of mastoid air cells. Orbits are unremarkable. Other: None. CT CERVICAL SPINE FINDINGS Alignment: Normal. Skull base and vertebrae: No acute fracture. No primary bone lesion or focal pathologic process. Soft tissues and spinal canal: No prevertebral fluid or swelling. No visible canal hematoma. Disc levels: Mild spondylosis of the cervical spine with predominantly discogenic degenerative changes greatest at the C5-C7 levels. Upper chest: Negative. Other: 15 mm nodule within the left lobe of the thyroid gland. IMPRESSION: CT head: 1. Acute hemorrhage  centered in right basal ganglia with extension into right cerebral hemisphere measuring up to 8.4 cm, 79 CC. 2. Extension of hemorrhage into the intraventricular system, greatest in the right lateral ventricle. 3. Mass effect with 10 mm right-to-left midline shift, right-sided uncal herniation, and partial effacement of basilar cisterns. No downward herniation at this time. CT cervical spine. 1. No acute fracture or dislocation of the cervical spine. 2. 15 mm nodule within left lobe of thyroid gland. Further evaluation with thyroid ultrasound is recommended on a nonemergent basis. 3. Mild spondylosis of the cervical spine. Critical Value/emergent results were called by telephone at the time of interpretation on 03/18/2018 at 5:55 pm to Dr. Nanda Quinton , who verbally acknowledged these results. Electronically Signed   By: Kristine Garbe M.D.   On: 03/16/2018 18:00   Dg Chest Portable 1 View  Result Date: 03/22/2018 CLINICAL DATA:  Endotracheal  tube adjustment. EXAM: PORTABLE CHEST 1 VIEW COMPARISON:  03/12/2018 at 1805 hours FINDINGS: Since the earlier exam, the endotracheal tube has been retracted. Tip now projects 1 cm above the Carina. Nasal/orogastric tube is well positioned passing below the diaphragm into the mid stomach. There is significant improvement in lung aeration on the left following endotracheal tube adjustment. There is persistent opacity at the left lung base consistent with pneumonia or atelectasis. Right lung remains clear. IMPRESSION: 1. Endotracheal tube is now well-positioned with subsequent significant improvement in left lung aeration. Electronically Signed   By: Lajean Manes M.D.   On: 02/23/2018 19:14   Dg Chest Portable 1 View  Addendum Date: 03/08/2018   ADDENDUM REPORT: 03/05/2018 18:43 ADDENDUM: The original report was by Dr. Van Clines. The following addendum is by Dr. Van Clines: Critical Value/emergent results were called by telephone at the time of  interpretation on 03/04/2018 at 6:43 pm to Dr. Nanda Quinton , who verbally acknowledged these results. Electronically Signed   By: Van Clines M.D.   On: 03/17/2018 18:43   Result Date: 03/12/2018 CLINICAL DATA:  Orogastric tube placement EXAM: PORTABLE CHEST 1 VIEW COMPARISON:  03/24/2018 at 5:09 p.m. FINDINGS: The endotracheal tube is just into the right mainstem bronchus. There is interval atelectasis of most of the left lung. Retraction of 5 cm is recommended. An orogastric tube enters the stomach. The right lung appears clear.  Left heart border obscured. IMPRESSION: 1. Right mainstem bronchus intubation, with interval atelectasis of much of the left lung. I recommend retracting the endotracheal tube 5 cm and obtaining a repeat chest radiograph. Radiology assistant personnel have been notified to put me in telephone contact with the referring physician or the referring physician's clinical representative in order to discuss these findings. Once this communication is established I will issue an addendum to this report for documentation purposes. Electronically Signed: By: Van Clines M.D. On: 03/13/2018 18:38   Dg Chest Portable 1 View  Result Date: 03/11/2018 CLINICAL DATA:  Per EMS- pt here from home, was found in a bathtub face up. She was unresponsive and required assisted ventilations with a BVM. Pt now on non rebreather. Hx of ETOH abuse and seizures. No hx of drug abuse. Hx of HTN. Former smoker EXAM: PORTABLE CHEST 1 VIEW COMPARISON:  None. FINDINGS: Cardiac silhouette is normal in size. No mediastinal or hilar masses. No evidence of adenopathy. Lungs are clear. No gross pleural effusion or pneumothorax on this supine study. Skeletal structures are grossly intact. IMPRESSION: No active disease. Electronically Signed   By: Lajean Manes M.D.   On: 02/28/2018 17:20   Dg Abd Portable 1v  Result Date: 03/07/2018 CLINICAL DATA:  Orogastric tube placement. EXAM: PORTABLE ABDOMEN - 1 VIEW  COMPARISON:  None. FINDINGS: Nasogastric tube tip projects in mid stomach. The included bowel gas pattern is normal. No radio-opaque calculi or other significant radiographic abnormality are seen. Dense LEFT lung consolidation. IMPRESSION: Nasogastric tube tip projects in mid stomach. Electronically Signed   By: Elon Alas M.D.   On: 03/02/2018 18:36    Review of Systems - per HPI   Blood pressure (!) 126/57, pulse 93, resp. rate 16, height 5\' 2"  (1.575 m), SpO2 98 %. Physical Exam  Assessment/Plan: I discussed situation and patient's neurologic status with Dr. Cheral Marker and with patient's sister and patient's mother.  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  She may be  able to speak and communicate.  They have indicated that the patient would not like to live like this and have therefore decided that they do not want to pursue surgical intervention.  They are aware that the patient may not survive this event, but would like to pursue non-surgical management of this patient's condition, including continued medical management.    Peggyann Shoals, MD 03/03/2018, 8:40 PM

## 2018-02-26 NOTE — ED Notes (Signed)
Vent tube adjusted by MD now 23 @ the lip.

## 2018-02-26 NOTE — H&P (Signed)
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 female with a PMHx of HTN, seizures and migraine headaches, who presents from home after being found by family unresponsive in an empty bathtub face up. On EMS arrival, she was unresponsive, incontinent of stool and had labored respirations, requiring assisted ventilations with a BVM. On arrival to the ED she responded to painful stimuli. CBG was 225.   She has a history of seizures and is on Lamictal. Her last seizure was about 3 years ago, per her sister. She has a history of EtOH abuse and had recently been trying to quit. Her last drink per sister's best guess was about 7 days ago. She has no history of hard drug abuse but occasionally uses marijuana.  Hx of ETOH abuse and seizures. No hx of drug abuse.   Per sister, the patient last spoke with her mother over the telephone at about 3 PM and sounded normal. Another sister, who had found the patient unresponsive, noted that a stool covered nightgown was in the washer and that it appeared that the patient may have been trying to get ready to take a shower prior to collapsing in the bathtub.     Past Medical History:  Diagnosis Date  . Anxiety   . Depression   . Hypertension   . Migraines   . Seizures ([place])    last seizure 3 years ago per pt    Past Surgical History:  Procedure Laterality Date  . EXAM UNDER ANESTHESIA WITH MANIPULATION OF SHOULDER    . INCISION AND DRAINAGE     left lower side- staph infection  . STAPEDES SURGERY      Family History  Problem Relation Age of Onset  . Diabetes Mother   . Non-Hodgkin's lymphoma Sister   . Rectal cancer Maternal Grandfather   . Hypertension Maternal Grandfather   . Diabetes Maternal Grandfather   . Colon cancer Neg Hx    Social History:  reports that she has quit smoking. She has never used smokeless tobacco. She  reports current alcohol use of about 14.0 standard drinks of alcohol per week. She reports that she does not use drugs.  Allergies:  Allergies  Allergen Reactions  . Aspirin Other (See Comments) and Nausea And Vomiting  . Lovastatin Diarrhea  . Simvastatin Diarrhea  . Statins Other (See Comments)    Diarrhea   . Tetracyclines & Related Nausea Only    Home Medications   ROS: Unable to obtain due to sedation  Physical Examination: Blood pressure 119/80, pulse 88, resp. rate (!) 23, height 5\' 2"  (1.575 m), SpO2 100 %.  HEENT-  [place] 5/AT  Lungs - Intubated on ventilator Extremities - Warm and well perfused  Neurologic Examination: Mental Status: Intubated and sedated on propofol. No eye opening spontaneously or to command. Flails RUE and RLE to sternal rub. Occasional extensor posturing. No response to voice.  Cranial Nerves: II:  Pupils 3 mm bilaterally and unreactive (sedated). No blink to threat.   III,IV, VI: No doll's eye reflex (sedated). Eyes conjugate at the midline.  VII: Face flaccidly symmetric.  VIII: No response to voice IX,X: Intubated XI: Unable to assess XII: Intubated Motor/Sensory: RUE: Flails nonpurposefully to noxious stimuli.  RLE: Withdraws to noxious and flails nonpurposefully intermittently LUE: Increased extensor tone. Intermittent low amplitude extension and flexion at elbow is non-rhythmic and does not appear  to be consistent with myoclonus or seizure LLE: Increased extensor tone. Intermittent extension, adduction and internal rotation.  Muscle bulk is normal x 4.  Deep Tendon Reflexes:  3+ bilateral biceps and brachioradialis 4+ patellar and achilles reflexes bilaterally Sustained clonus with passive dorsiflexion at ankles bilaterally.  Toes upgoing bilaterally  Cerebellar/Gait: Unable to assess   Results for orders placed or performed during the hospital encounter of 03/21/2018 (from the past 48 hour(s))  I-stat troponin, ED     Status: Abnormal     Collection Time: 03/03/2018  5:14 PM  Result Value Ref Range   Troponin i, poc 0.30 (HH) 0.00 - 0.08 ng/mL   Comment NOTIFIED PHYSICIAN    Comment 3            Comment: Due to the release kinetics of cTnI, a negative result within the first hours of the onset of symptoms does not rule out myocardial infarction with certainty. If myocardial infarction is still suspected, repeat the test at appropriate intervals.   I-Stat beta hCG blood, ED     Status: None   Collection Time: 03/04/2018  5:15 PM  Result Value Ref Range   I-stat hCG, quantitative <5.0 <5 mIU/mL   Comment 3            Comment:   GEST. AGE      CONC.  (mIU/mL)   <=1 WEEK        5 - 50     2 WEEKS       50 - 500     3 WEEKS       100 - 10,000     4 WEEKS     1,000 - 30,000        FEMALE AND NON-PREGNANT FEMALE:     LESS THAN 5 mIU/mL   I-Stat Chem 8, ED     Status: Abnormal   Collection Time: 03/17/2018  5:16 PM  Result Value Ref Range   Sodium 135 135 - 145 mmol/L   Potassium 3.3 (L) 3.5 - 5.1 mmol/L   Chloride 99 98 - 111 mmol/L   BUN 13 6 - 20 mg/dL   Creatinine, Ser 0.90 0.44 - 1.00 mg/dL   Glucose, Bld 244 (H) 70 - 99 mg/dL   Calcium, Ion 1.19 1.15 - 1.40 mmol/L   TCO2 24 22 - 32 mmol/L   Hemoglobin 17.7 (H) 12.0 - 15.0 g/dL   HCT 52.0 (H) 36.0 - 46.0 %  Ethanol     Status: None   Collection Time: 03/18/2018  5:16 PM  Result Value Ref Range   Alcohol, Ethyl (B) <10 <10 mg/dL    Comment: (NOTE) Lowest detectable limit for serum alcohol is 10 mg/dL. For medical purposes only. Performed at [facility], [place] [address], [place], [place] [address]   Protime-INR     Status: None   Collection Time: 03/18/2018  5:16 PM  Result Value Ref Range   Prothrombin Time 13.0 11.4 - 15.2 seconds   INR 0.99     Comment: Performed at [facility] [address], [place], [place] [address]  APTT     Status: None   Collection Time: 03/04/2018  5:16 PM  Result Value Ref Range   aPTT 24 24 - 36 seconds    Comment:  Performed at [facility] [address] [place],  [address]  CBC     Status: Abnormal   Collection Time: 03/19/2018  5:16 PM  Result Value Ref Range   WBC 25.5 (H) 4.0 - 10.5 K/uL   RBC 5.58 (H) 3.87 - 5.11 MIL/uL   Hemoglobin 16.5 (H) 12.0 - 15.0 g/dL   HCT 50.9 (H) 36.0 - 46.0 %   MCV 91.2 80.0 - 100.0 fL   MCH 29.6 26.0 - 34.0 pg   MCHC 32.4 30.0 - 36.0 g/dL   RDW 12.2 11.5 - 15.5 %   Platelets 305 150 - 400 K/uL   nRBC 0.0 0.0 - 0.2 %    Comment: Performed at [facility] [address] [place], [place] [address]  Differential     Status: Abnormal   Collection Time: 03/23/2018  5:16 PM  Result Value Ref Range   Neutrophils Relative % 83 %   Neutro Abs 21.5 (H) 1.7 - 7.7 K/uL   Lymphocytes Relative 11 %   Lymphs Abs 2.7 0.7 - 4.0 K/uL   Monocytes Relative 4 %   Monocytes Absolute 1.0 0.1 - 1.0 K/uL   Eosinophils Relative 0 %   Eosinophils Absolute 0.0 0.0 - 0.5 K/uL   Basophils Relative 1 %   Basophils Absolute 0.1 0.0 - 0.1 K/uL   RBC Morphology MORPHOLOGY UNREMARKABLE    Immature Granulocytes 1 %   Abs Immature Granulocytes 0.24 (H) 0.00 - 0.07 K/uL    Comment: Performed at [facility] [address] [place], [place] [address]  Comprehensive metabolic panel     Status: Abnormal   Collection Time: 03/25/2018  5:16 PM  Result Value Ref Range   Sodium 134 (L) 135 - 145 mmol/L   Potassium 3.4 (L) 3.5 - 5.1 mmol/L   Chloride 98 98 - 111 mmol/L   CO2 20 (L) 22 - 32 mmol/L   Glucose, Bld 267 (H) 70 - 99 mg/dL   BUN 12 6 - 20 mg/dL   Creatinine, Ser 1.18 (H) 0.44 - 1.00 mg/dL   Calcium 9.3 8.9 - 10.3 mg/dL   Total Protein 8.2 (H) 6.5 - 8.1 g/dL   Albumin 4.6 3.5 - 5.0 g/dL   AST 81 (H) 15 - 41 U/L   ALT 50 (H) 0 - 44 U/L   Alkaline Phosphatase 90 38 - 126 U/L   Total Bilirubin 0.7 0.3 - 1.2 mg/dL   GFR calc non Af Amer 53 (L) >60 mL/min   GFR calc Af Amer >60 >60 mL/min   Anion gap 16 (H) 5 - 15    Comment: Performed at [facility], [place] [address], [place], [place] [address]  CK     Status: Abnormal   Collection Time: 03/05/2018  5:16 PM  Result Value Ref Range   Total CK 2,884 (H) 38 - 234 U/L    Comment: Performed at [facility], [place] [address], [place], [place] [address]  I-Stat venous blood gas, ED     Status: Abnormal   Collection Time: 02/25/2018  5:16 PM  Result Value Ref Range   pH, Ven 7.209 (L) 7.250 - 7.430   pCO2, Ven 62.0 (H) 44.0 - 60.0 mmHg   pO2, Ven 42.0 32.0 - 45.0 mmHg   Bicarbonate 24.8 20.0 - 28.0 mmol/L   TCO2 27 22 - 32 mmol/L   O2 Saturation 65.0 %   Acid-base deficit 5.0 (H) 0.0 - 2.0 mmol/L   Patient temperature HIDE    Sample type VENOUS    Ct Head Wo Contrast  Result Date: 03/17/2018 CLINICAL DATA:  53 y/o F;  altered level of consciousness. Found unresponsive in a bathtub. EXAM: CT HEAD WITHOUT CONTRAST CT CERVICAL SPINE WITHOUT CONTRAST TECHNIQUE: Multidetector CT imaging of the head and cervical spine was performed following the standard protocol without intravenous contrast. Multiplanar CT image reconstructions of the cervical spine were also generated. COMPARISON:  01/09/2016 cervical spine radiographs FINDINGS: CT HEAD FINDINGS Brain: Acute hemorrhage centered in the right basal ganglia with extension into the right cerebral hemisphere measuring 8.4 x 3.9 x 4.6 cm (volume = 79 cm^3) (AP x ML x CC series 4, image 24 and series 6, image 38). There is extension of the hematoma into the intraventricular system, predominantly the right lateral ventricle. Associated edema and mass effect results in 10 mm of right-to-left midline shift, right-sided uncal herniation, and partial effacement of the basilar cisterns. No transtentorial or foramen magnum herniation. Vascular: No hyperdense vessel or unexpected calcification. Skull: Normal. Negative for fracture or focal lesion. Sinuses/Orbits: Small paranasal sinus fluid levels. Normal aeration of mastoid air cells. Orbits are unremarkable. Other:  None. CT CERVICAL SPINE FINDINGS Alignment: Normal. Skull base and vertebrae: No acute fracture. No primary bone lesion or focal pathologic process. Soft tissues and spinal canal: No prevertebral fluid or swelling. No visible canal hematoma. Disc levels: Mild spondylosis of the cervical spine with predominantly discogenic degenerative changes greatest at the C5-C7 levels. Upper chest: Negative. Other: 15 mm nodule within the left lobe of the thyroid gland. IMPRESSION: CT head: 1. Acute hemorrhage centered in right basal ganglia with extension into right cerebral hemisphere measuring up to 8.4 cm, 79 CC. 2. Extension of hemorrhage into the intraventricular system, greatest in the right lateral ventricle. 3. Mass effect with 10 mm right-to-left midline shift, right-sided uncal herniation, and partial effacement of basilar cisterns. No downward herniation at this time. CT cervical spine. 1. No acute fracture or dislocation of the cervical spine. 2. 15 mm nodule within left lobe of thyroid gland. Further evaluation with thyroid ultrasound is recommended on a nonemergent basis. 3. Mild spondylosis of the cervical spine. Critical Value/emergent results were called by telephone at the time of interpretation on 03/25/2018 at 5:55 pm to Dr. Nanda Quinton , who verbally acknowledged these results. Electronically Signed   By: Kristine Garbe M.D.   On: 02/23/2018 18:00   Ct Cervical Spine Wo Contrast  Result Date: 02/25/2018 CLINICAL DATA:  53 y/o F; altered level of consciousness. Found unresponsive in a bathtub. EXAM: CT HEAD WITHOUT CONTRAST CT CERVICAL SPINE WITHOUT CONTRAST TECHNIQUE: Multidetector CT imaging of the head and cervical spine was performed following the standard protocol without intravenous contrast. Multiplanar CT image reconstructions of the cervical spine were also generated. COMPARISON:  01/09/2016 cervical spine radiographs FINDINGS: CT HEAD FINDINGS Brain: Acute hemorrhage centered in the right  basal ganglia with extension into the right cerebral hemisphere measuring 8.4 x 3.9 x 4.6 cm (volume = 79 cm^3) (AP x ML x CC series 4, image 24 and series 6, image 38). There is extension of the hematoma into the intraventricular system, predominantly the right lateral ventricle. Associated edema and mass effect results in 10 mm of right-to-left midline shift, right-sided uncal herniation, and partial effacement of the basilar cisterns. No transtentorial or foramen magnum herniation. Vascular: No hyperdense vessel or unexpected calcification. Skull: Normal. Negative for fracture or focal lesion. Sinuses/Orbits: Small paranasal sinus fluid levels. Normal aeration of mastoid air cells. Orbits are unremarkable. Other: None. CT CERVICAL SPINE FINDINGS Alignment: Normal. Skull base and vertebrae: No acute fracture. No primary bone lesion  or focal pathologic process. Soft tissues and spinal canal: No prevertebral fluid or swelling. No visible canal hematoma. Disc levels: Mild spondylosis of the cervical spine with predominantly discogenic degenerative changes greatest at the C5-C7 levels. Upper chest: Negative. Other: 15 mm nodule within the left lobe of the thyroid gland. IMPRESSION: CT head: 1. Acute hemorrhage centered in right basal ganglia with extension into right cerebral hemisphere measuring up to 8.4 cm, 79 CC. 2. Extension of hemorrhage into the intraventricular system, greatest in the right lateral ventricle. 3. Mass effect with 10 mm right-to-left midline shift, right-sided uncal herniation, and partial effacement of basilar cisterns. No downward herniation at this time. CT cervical spine. 1. No acute fracture or dislocation of the cervical spine. 2. 15 mm nodule within left lobe of thyroid gland. Further evaluation with thyroid ultrasound is recommended on a nonemergent basis. 3. Mild spondylosis of the cervical spine. Critical Value/emergent results were called by telephone at the time of interpretation on  03/04/2018 at 5:55 pm to Dr. Nanda Quinton , who verbally acknowledged these results. Electronically Signed   By: Kristine Garbe M.D.   On: 03/16/2018 18:00   Dg Chest Portable 1 View  Addendum Date: 03/10/2018   ADDENDUM REPORT: 03/07/2018 18:43 ADDENDUM: The original report was by Dr. Van Clines. The following addendum is by Dr. Van Clines: Critical Value/emergent results were called by telephone at the time of interpretation on 03/23/2018 at 6:43 pm to Dr. Nanda Quinton , who verbally acknowledged these results. Electronically Signed   By: Van Clines M.D.   On: 03/14/2018 18:43   Result Date: 03/08/2018 CLINICAL DATA:  Orogastric tube placement EXAM: PORTABLE CHEST 1 VIEW COMPARISON:  03/15/2018 at 5:09 p.m. FINDINGS: The endotracheal tube is just into the right mainstem bronchus. There is interval atelectasis of most of the left lung. Retraction of 5 cm is recommended. An orogastric tube enters the stomach. The right lung appears clear.  Left heart border obscured. IMPRESSION: 1. Right mainstem bronchus intubation, with interval atelectasis of much of the left lung. I recommend retracting the endotracheal tube 5 cm and obtaining a repeat chest radiograph. Radiology assistant personnel have been notified to put me in telephone contact with the referring physician or the referring physician's clinical representative in order to discuss these findings. Once this communication is established I will issue an addendum to this report for documentation purposes. Electronically Signed: By: Van Clines M.D. On: 03/20/2018 18:38   Dg Chest Portable 1 View  Result Date: 03/15/2018 CLINICAL DATA:  Per EMS- pt here from home, was found in a bathtub face up. She was unresponsive and required assisted ventilations with a BVM. Pt now on non rebreather. Hx of ETOH abuse and seizures. No hx of drug abuse. Hx of HTN. Former smoker EXAM: PORTABLE CHEST 1 VIEW COMPARISON:  None. FINDINGS: Cardiac  silhouette is normal in size. No mediastinal or hilar masses. No evidence of adenopathy. Lungs are clear. No gross pleural effusion or pneumothorax on this supine study. Skeletal structures are grossly intact. IMPRESSION: No active disease. Electronically Signed   By: Lajean Manes M.D.   On: 02/24/2018 17:20   Dg Abd Portable 1v  Result Date: 03/24/2018 CLINICAL DATA:  Orogastric tube placement. EXAM: PORTABLE ABDOMEN - 1 VIEW COMPARISON:  None. FINDINGS: Nasogastric tube tip projects in mid stomach. The included bowel gas pattern is normal. No radio-opaque calculi or other significant radiographic abnormality are seen. Dense LEFT lung consolidation. IMPRESSION: Nasogastric tube tip projects in mid stomach.  Electronically Signed   By: Elon Alas M.D.   On: 03/01/2018 18:36    CT head: 1. Acute hemorrhage centered in right basal ganglia with extension into right cerebral hemisphere measuring up to 8.4 cm, 79 CC. 2. Extension of hemorrhage into the intraventricular system, greatest in the right lateral ventricle. 3. Mass effect with 10 mm right-to-left midline shift, right-sided uncal herniation, and partial effacement of basilar cisterns. No downward herniation at this time.  CT cervical spine. 1. No acute fracture or dislocation of the cervical spine. 2. 15 mm nodule within left lobe of thyroid gland. Further evaluation with thyroid ultrasound is recommended on a nonemergent basis. 3. Mild spondylosis of the cervical spine.  Assessment: 53 y.o. female with acute right basal ganglia hemorrhage  1. The hemorrhage measures 79 cc. There is extension into the right cerebral hemisphere and ventricles as well as mass effect with 10 mm right to left midline shift, right sided uncal herniation and partial effacement of the basilar cisterns.  2. Was in respiratory acidosis on arrival. Now intubated on ventilator.  3. Elevated CK of 2884, consistent with rhabdomyolysis 4. Elevated troponin  of 0.3 5. Leukocytosis of 25.5  Recommendations: 1. Admitting to the ICU under the Neurology service with CCM consulting for ventilator and medical management 2. Repeat CT head in 6 hours.  3. 3% saline at 50 cc/hr with goal Na of 150-155 4. Discussed with family. She is currently a full code with maximal management, but they have decided not to have neurosurgical intervention. They have discussed this over the phone with Dr. Vertell Limber of Neurosurgery.  5. Cardiac telemetry 6. Frequent neuro checks 7. BP management with clevidipine drip  8. No antiplatelet medications or anticoagulants. DVT prophylaxis with SCDs  85 minutes spent in the emergent neurological evaluation and management of this critically ill patient  Electronically signed: Dr. Kerney Elbe 03/05/2018, 7:07 PM

## 2018-02-26 NOTE — ED Provider Notes (Signed)
Procedure Name: Intubation Date/Time: 03/05/2018 5:52 PM Performed by: Hulan Saas, MD Pre-anesthesia Checklist: Patient identified, Patient being monitored, Emergency Drugs available, Timeout performed and Suction available Oxygen Delivery Method: Non-rebreather mask Preoxygenation: Pre-oxygenation with 100% oxygen Induction Type: Rapid sequence Laryngoscope Size: Glidescope Grade View: Grade II Number of attempts: 1 Airway Equipment and Method: Rigid stylet Placement Confirmation: ETT inserted through vocal cords under direct vision,  Positive ETCO2 and Breath sounds checked- equal and bilateral Tube secured with: ETT holder Difficulty Due To: Difficult Airway- due to cervical collar        Hulan Saas, MD 03/16/2018 1753    Margette Fast, MD 03/12/2018 2116

## 2018-02-26 NOTE — Consult Note (Signed)
NAME:  Jacqueline Pratt, MRN:  [id number], DOB:  August 23, 1965, LOS: 0 ADMISSION DATE:  03/10/2018, CONSULTATION DATE:  03/25/2018 REFERRING MD:  Kerney Elbe, CHIEF COMPLAINT:  Found down   Brief History   72F found down, 8 cm basal ganglia hemorrhage. Intubated in ED  History of present illness   53 year old female history of alcohol abuse, hypertension, and seizures who was brought in after being found down. Patient is intubated and sedated. family present states she was seen by a family member this morning around 7:30 AM and then her mother talked to her on the phone several times after.  She seemed well at the time with no sick complaints.When the patient's sister went to her home she was in the bathtub.  Her family thought she was intoxicated initially but later noted agonal breathing and nonresponsive lying face up in the bathtub. When EMS arrived glucose 225. Provider in the ED was initially told she was following commands with abnormal speech however later this was refuted by the family and described to be posturing.  In the ED she was intubated for airway protection and admitted to Neurology.   Past Medical History  Alcohol abuse Hypertension seizures  Significant Hospital Events   03/05/2018 admitted. ETT  Consults:  PCCM  Procedures:  03/20/2018 ETT  Significant Diagnostic Tests:  03/22/2018 CTH: 8.4 cm hemorrhage in right basal ganglia with right sided uncal hernation  Micro Data:  none  Antimicrobials:  none     Objective   Blood pressure (!) 126/52, pulse 90, resp. rate 20, height 5\' 2"  (1.575 m), SpO2 100 %.    Vent Mode: PRVC FiO2 (%):  [50 %] 50 % Set Rate:  [20 bmp] 20 bmp Vt Set:  [440 mL] 440 mL PEEP:  [5 cmH20] 5 cmH20 Plateau Pressure:  [27 cmH20] 27 cmH20   Intake/Output Summary (Last 24 hours) at 03/02/2018 1942 Last data filed at 03/17/2018 1909 Gross per 24 hour  Intake 11.39 ml  Output 250 ml  Net -238.61 ml   There were no vitals filed for this  visit.  Examination: Physical Exam  Constitutional:  Intubated, sedated, ill appearing  HENT:  Periorbital edema. ETT in palce  Eyes: Right eye exhibits no discharge. Left eye exhibits no discharge. No scleral icterus.  Pupils 3-4 mm and reactive bilaterally. Gag/cough intact. Breathing over ventilator set rate  Neck: No JVD present.  Cardiovascular: Normal rate.  No murmur heard. Pulmonary/Chest: Effort normal and breath sounds normal. No stridor. No respiratory distress.  Abdominal: Soft. She exhibits no distension. There is no abdominal tenderness.  Musculoskeletal:        General: No deformity or edema.  Neurological:  Does not withdraw to pain on the left. Withdraws in RUE/RLE. +gag/cough.  Skin: Skin is warm and dry.      Assessment & Plan:  53 year old female history of alcohol abuse, hypertension, and seizures who was brought in after being found down with unknown last normal. Found to have 8.4 cm right basal ganglia hemorrhage.  # right basal ganglia hemorrhage # malignant hypertension - management per Neurology. Family states they do not want surgery and are opting for medical management. Goals of care ongoing discussion with Neurology. Likely will get hypertonic saline and reassess progress with CTH - BP control with clevidipine drip per Neurology  # s/p intubation for airway protection - post-intubation blood gass with adequate ventilation - sedation goal RASS 0 with PRN fentanyl boluses - on minimal  ventilator support, daily spontaneous breathing trial and reassess mental status/goals of care to consider extubation  # Anion gap metabolic acidosis - check lactic acid and beta-hydroxybutyrate. If normal then most likely due to mild AKI  # AKI - suspect hypovolemia versus less likely mild rhabdomyolysis. Will trend CK to be sure this is not trending up.  - Monitor serial renal function and consider renal ultrasound and urine electrolytes if not improving or  oliguric  # leukocytosis, suspect reactive - patient afebrile, no infiltrate on CXR or oxygen requirement. Per family was not complaining of any infectious symptoms prior to being found down. Will check urine and tracheal aspirate cultures to guide management in case she develops fevers, hemodynamic instability, or other evidence of infection but in the absences of this will hold on antibiotics  # Alcohol abuse - per family she was not having any evidence of alcohol withdrawal prior to her being found down - thiamine/folate - CIWA protocol if she is extubated  Best practice:  Diet: NPO Pain/Anxiety/Delirium protocol (if indicated): fentanyl VAP protocol (if indicated): ordered DVT prophylaxis: contraindicated with bleed GI prophylaxis: PPI Glucose control: sliding scale insulin Mobility: contraindicated at this time Code Status: full code Family Communication: updated at bedside 1/4 Disposition: ICU under neurology care  Labs   CBC: Recent Labs  Lab 03/21/2018 1716  WBC 25.5*  NEUTROABS 21.5*  HGB 16.5*  17.7*  HCT 50.9*  52.0*  MCV 91.2  PLT 741    Basic Metabolic Panel: Recent Labs  Lab 03/13/2018 1716  NA 134*  135  K 3.4*  3.3*  CL 98  99  CO2 20*  GLUCOSE 267*  244*  BUN 12  13  CREATININE 1.18*  0.90  CALCIUM 9.3   GFR: CrCl cannot be calculated (Unknown ideal weight.). Recent Labs  Lab 03/19/2018 1716  WBC 25.5*    Liver Function Tests: Recent Labs  Lab 03/24/2018 1716  AST 81*  ALT 50*  ALKPHOS 90  BILITOT 0.7  PROT 8.2*  ALBUMIN 4.6   No results for input(s): LIPASE, AMYLASE in the last 168 hours. No results for input(s): AMMONIA in the last 168 hours.  ABG    Component Value Date/Time   HCO3 24.8 03/04/2018 1716   TCO2 24 02/25/2018 1716   TCO2 27 03/06/2018 1716   ACIDBASEDEF 5.0 (H) 03/21/2018 1716   O2SAT 65.0 03/02/2018 1716     Coagulation Profile: Recent Labs  Lab 03/12/2018 1716  INR 0.99    Cardiac Enzymes: Recent  Labs  Lab 03/19/2018 1716  CKTOTAL 2,884*    HbA1C: No results found for: HGBA1C  CBG: No results for input(s): GLUCAP in the last 168 hours.  Review of Systems:   Unable to obtain  Past Medical History  She,  has a past medical history of Anxiety, Depression, Hypertension, Migraines, and Seizures ([place]).   Surgical History    Past Surgical History:  Procedure Laterality Date  . EXAM UNDER ANESTHESIA WITH MANIPULATION OF SHOULDER    . INCISION AND DRAINAGE     left lower side- staph infection  . STAPEDES SURGERY       Social History   reports that she has quit smoking. She has never used smokeless tobacco. She reports current alcohol use of about 14.0 standard drinks of alcohol per week. She reports that she does not use drugs.   Family History   Her family history includes Diabetes in her maternal grandfather and mother; Hypertension in her maternal grandfather; Non-Hodgkin's  lymphoma in her sister; Rectal cancer in her maternal grandfather. There is no history of Colon cancer.   Allergies Allergies  Allergen Reactions  . Aspirin Other (See Comments) and Nausea And Vomiting  . Lovastatin Diarrhea  . Simvastatin Diarrhea  . Statins Other (See Comments)    Diarrhea   . Tetracyclines & Related Nausea Only     Home Medications  Prior to Admission medications   Medication Sig Start Date End Date Taking? Authorizing Provider  amLODipine (NORVASC) 5 MG tablet Take 5 mg by mouth daily.    [provider]  Cholecalciferol (VITAMIN D PO) Take by mouth daily.    [provider]  clonazePAM (KLONOPIN) 0.5 MG tablet Take 0.5 mg by mouth 2 (two) times daily as needed for anxiety.     [provider]  cyclobenzaprine (FLEXERIL) 10 MG tablet Take 1 tablet (10 mg total) by mouth 3 (three) times daily as needed for muscle spasms. Patient taking differently: Take 10 mg by mouth every 8 (eight) hours as needed for muscle spasms.  04/10/16   Suzan Slick, NP   escitalopram (LEXAPRO) 10 MG tablet Take 10 mg by mouth daily.  11/27/16 03/29/18  [provider]  fluticasone (FLONASE) 50 MCG/ACT nasal spray Place into the nose.    [provider]  lamoTRIgine (LAMICTAL) 100 MG tablet Take 100 mg by mouth 2 (two) times daily.    [provider]  levonorgestrel (MIRENA) 20 MCG/24HR IUD by Intrauterine route.    [provider]  lisinopril-hydrochlorothiazide (PRINZIDE,ZESTORETIC) 20-25 MG tablet Take 1 tablet by mouth daily.    [provider]  meloxicam (MOBIC) 7.5 MG tablet TAKE 2 TABLETS BY MOUTH EVERY DAY AS NEEDED FOR PAIN 03/17/17   Suzan Slick, NP  Multiple Vitamins-Minerals (MULTIVITAMIN WITH MINERALS) tablet Take 1 tablet by mouth daily.    [provider]  Riboflavin 400 MG CAPS Take 400 mg daily 04/08/15   [provider]     Critical care time: 6

## 2018-02-26 NOTE — ED Notes (Signed)
Per MD: Max of 32 for Cleviprex.

## 2018-02-26 NOTE — ED Provider Notes (Signed)
Emergency Department Provider Note   I have reviewed the triage vital signs and the nursing notes.   HISTORY  Chief Complaint Unresponsive   HPI Jacqueline Pratt is a 53 y.o. female with PMH of HTN, seizure, and migraine HA presents to the ED by EMS after being found unresponsive in a empty bathtub.  She was incontinent of stool on scene and had labored respirations.  EMS state that family found the patient but she has an unknown last seen normal time.  They had to provide assist ventilations in the field but deny any vomiting.  Patient began to breathe spontaneously and follow commands in route.  According to family on scene the patient has been dealing with alcohol problems and the patient does have a history of seizure.  Patient is unable to provide any meaningful history.  She will follow commands but speech is abnormal.   Level 5 caveat: AMS    Past Medical History:  Diagnosis Date  . Anxiety   . Depression   . Hypertension   . Migraines   . Seizures ([place])    last seizure 3 years ago per pt    Patient Active Problem List   Diagnosis Date Noted  . Dislocation, shoulder, anterior, right, initial encounter 03/05/2016  . HTN (hypertension) 12/13/2015  . Migraines 12/13/2015  . Sciatic leg pain 12/13/2015  . Anxiety 12/13/2015  . Seizure disorder ([place]) 12/13/2015  . Headache, unspecified headache type 10/14/2015  . Obesity (BMI 30.0-34.9) 10/03/2013  . Orthostasis 04/05/2013  . Spell of altered cognition 04/05/2013  . Dyslipidemia, goal LDL below 100 09/15/2012    Past Surgical History:  Procedure Laterality Date  . EXAM UNDER ANESTHESIA WITH MANIPULATION OF SHOULDER    . INCISION AND DRAINAGE     left lower side- staph infection  . STAPEDES SURGERY      Allergies Aspirin; Lovastatin; Simvastatin; Statins; and Tetracyclines & related  Family History  Problem Relation Age of Onset  . Diabetes Mother   . Non-Hodgkin's lymphoma Sister   . Rectal cancer  Maternal Grandfather   . Hypertension Maternal Grandfather   . Diabetes Maternal Grandfather   . Colon cancer Neg Hx     Social History Social History   Tobacco Use  . Smoking status: Former Research scientist (life sciences)  . Smokeless tobacco: Never Used  . Tobacco comment: quit 5 years ago per pt  Substance Use Topics  . Alcohol use: Yes    Alcohol/week: 14.0 standard drinks    Types: 14 Standard drinks or equivalent per week    Comment: vodka-2 drinks per night per pt  . Drug use: No    Review of Systems  Level 5 caveat: AMS   ____________________________________________   PHYSICAL EXAM:  VITAL SIGNS: Vitals:   02/23/2018 2100 03/24/2018 2107  BP: 120/62 126/74  Pulse: 78 78  Resp: 18 18  Temp:  97.9 F (36.6 C)  SpO2: 100% 100%    Constitutional: Drowsy but eyes open to voice. Following commands on the right side only.  Eyes: Conjunctivae are normal. PERRL.  Head: Atraumatic. Nose: No congestion/rhinnorhea. Mouth/Throat: Mucous membranes are moist. Neck: No stridor. C-collar in place.  Cardiovascular: Normal rate, regular rhythm. Good peripheral circulation. Grossly normal heart sounds.   Respiratory: Normal respiratory effort.  No retractions. Lungs CTAB. Gastrointestinal: Soft and nontender. No distention.  Musculoskeletal: No gross deformities of extremities. Neurologic: Aphasia with no purposeful movement on the left. Patient does withdrawal to pain on the left. Following commands briskly on  the right.  Skin:  Skin is warm, dry and intact. No rash noted.  ____________________________________________   LABS (all labs ordered are listed, but only abnormal results are displayed)  Labs Reviewed  CBC - Abnormal; Notable for the following components:      Result Value   WBC 25.5 (*)    RBC 5.58 (*)    Hemoglobin 16.5 (*)    HCT 50.9 (*)    All other components within normal limits  DIFFERENTIAL - Abnormal; Notable for the following components:   Neutro Abs 21.5 (*)    Abs  Immature Granulocytes 0.24 (*)    All other components within normal limits  COMPREHENSIVE METABOLIC PANEL - Abnormal; Notable for the following components:   Sodium 134 (*)    Potassium 3.4 (*)    CO2 20 (*)    Glucose, Bld 267 (*)    Creatinine, Ser 1.18 (*)    Total Protein 8.2 (*)    AST 81 (*)    ALT 50 (*)    GFR calc non Af Amer 53 (*)    Anion gap 16 (*)    All other components within normal limits  CK - Abnormal; Notable for the following components:   Total CK 2,884 (*)    All other components within normal limits  I-STAT CHEM 8, ED - Abnormal; Notable for the following components:   Potassium 3.3 (*)    Glucose, Bld 244 (*)    Hemoglobin 17.7 (*)    HCT 52.0 (*)    All other components within normal limits  I-STAT TROPONIN, ED - Abnormal; Notable for the following components:   Troponin i, poc 0.30 (*)    All other components within normal limits  I-STAT VENOUS BLOOD GAS, ED - Abnormal; Notable for the following components:   pH, Ven 7.209 (*)    pCO2, Ven 62.0 (*)    Acid-base deficit 5.0 (*)    All other components within normal limits  CULTURE, RESPIRATORY  ETHANOL  PROTIME-INR  APTT  RAPID URINE DRUG SCREEN, HOSP PERFORMED  URINALYSIS, ROUTINE W REFLEX MICROSCOPIC  BASIC METABOLIC PANEL  LACTIC ACID, PLASMA  LACTIC ACID, PLASMA  I-STAT BETA HCG BLOOD, ED ([facility], [facility], [facility] ONLY)   ____________________________________________  EKG   EKG Interpretation  Date/Time:  Saturday February 26 2018 17:01:32 EST Ventricular Rate:  99 PR Interval:    QRS Duration: 96 QT Interval:  364 QTC Calculation: 468 R Axis:   56 Text Interpretation:  Sinus rhythm Atrial premature complex Probable LVH with secondary repol abnrm ST depr, consider ischemia, inferior leads Artifact in lead(s) I II III aVR aVL V1 No STEMI  Confirmed by Nanda Quinton [phone]) on 02/28/2018 5:05:34 PM       ____________________________________________  RADIOLOGY  Ct Head Wo Contrast  Result  Date: 03/15/2018 CLINICAL DATA:  53 y/o F; altered level of consciousness. Found unresponsive in a bathtub. EXAM: CT HEAD WITHOUT CONTRAST CT CERVICAL SPINE WITHOUT CONTRAST TECHNIQUE: Multidetector CT imaging of the head and cervical spine was performed following the standard protocol without intravenous contrast. Multiplanar CT image reconstructions of the cervical spine were also generated. COMPARISON:  01/09/2016 cervical spine radiographs FINDINGS: CT HEAD FINDINGS Brain: Acute hemorrhage centered in the right basal ganglia with extension into the right cerebral hemisphere measuring 8.4 x 3.9 x 4.6 cm (volume = 79 cm^3) (AP x ML x CC series 4, image 24 and series 6, image 38). There is extension of the hematoma into the intraventricular  system, predominantly the right lateral ventricle. Associated edema and mass effect results in 10 mm of right-to-left midline shift, right-sided uncal herniation, and partial effacement of the basilar cisterns. No transtentorial or foramen magnum herniation. Vascular: No hyperdense vessel or unexpected calcification. Skull: Normal. Negative for fracture or focal lesion. Sinuses/Orbits: Small paranasal sinus fluid levels. Normal aeration of mastoid air cells. Orbits are unremarkable. Other: None. CT CERVICAL SPINE FINDINGS Alignment: Normal. Skull base and vertebrae: No acute fracture. No primary bone lesion or focal pathologic process. Soft tissues and spinal canal: No prevertebral fluid or swelling. No visible canal hematoma. Disc levels: Mild spondylosis of the cervical spine with predominantly discogenic degenerative changes greatest at the C5-C7 levels. Upper chest: Negative. Other: 15 mm nodule within the left lobe of the thyroid gland. IMPRESSION: CT head: 1. Acute hemorrhage centered in right basal ganglia with extension into right cerebral hemisphere measuring up to 8.4 cm, 79 CC. 2. Extension of hemorrhage into the intraventricular system, greatest in the right lateral  ventricle. 3. Mass effect with 10 mm right-to-left midline shift, right-sided uncal herniation, and partial effacement of basilar cisterns. No downward herniation at this time. CT cervical spine. 1. No acute fracture or dislocation of the cervical spine. 2. 15 mm nodule within left lobe of thyroid gland. Further evaluation with thyroid ultrasound is recommended on a nonemergent basis. 3. Mild spondylosis of the cervical spine. Critical Value/emergent results were called by telephone at the time of interpretation on 03/19/2018 at 5:55 pm to Dr. Nanda Quinton , who verbally acknowledged these results. Electronically Signed   By: Kristine Garbe M.D.   On: 03/22/2018 18:00   Ct Cervical Spine Wo Contrast  Result Date: 03/19/2018 CLINICAL DATA:  53 y/o F; altered level of consciousness. Found unresponsive in a bathtub. EXAM: CT HEAD WITHOUT CONTRAST CT CERVICAL SPINE WITHOUT CONTRAST TECHNIQUE: Multidetector CT imaging of the head and cervical spine was performed following the standard protocol without intravenous contrast. Multiplanar CT image reconstructions of the cervical spine were also generated. COMPARISON:  01/09/2016 cervical spine radiographs FINDINGS: CT HEAD FINDINGS Brain: Acute hemorrhage centered in the right basal ganglia with extension into the right cerebral hemisphere measuring 8.4 x 3.9 x 4.6 cm (volume = 79 cm^3) (AP x ML x CC series 4, image 24 and series 6, image 38). There is extension of the hematoma into the intraventricular system, predominantly the right lateral ventricle. Associated edema and mass effect results in 10 mm of right-to-left midline shift, right-sided uncal herniation, and partial effacement of the basilar cisterns. No transtentorial or foramen magnum herniation. Vascular: No hyperdense vessel or unexpected calcification. Skull: Normal. Negative for fracture or focal lesion. Sinuses/Orbits: Small paranasal sinus fluid levels. Normal aeration of mastoid air cells. Orbits  are unremarkable. Other: None. CT CERVICAL SPINE FINDINGS Alignment: Normal. Skull base and vertebrae: No acute fracture. No primary bone lesion or focal pathologic process. Soft tissues and spinal canal: No prevertebral fluid or swelling. No visible canal hematoma. Disc levels: Mild spondylosis of the cervical spine with predominantly discogenic degenerative changes greatest at the C5-C7 levels. Upper chest: Negative. Other: 15 mm nodule within the left lobe of the thyroid gland. IMPRESSION: CT head: 1. Acute hemorrhage centered in right basal ganglia with extension into right cerebral hemisphere measuring up to 8.4 cm, 79 CC. 2. Extension of hemorrhage into the intraventricular system, greatest in the right lateral ventricle. 3. Mass effect with 10 mm right-to-left midline shift, right-sided uncal herniation, and partial effacement of basilar cisterns. No downward  herniation at this time. CT cervical spine. 1. No acute fracture or dislocation of the cervical spine. 2. 15 mm nodule within left lobe of thyroid gland. Further evaluation with thyroid ultrasound is recommended on a nonemergent basis. 3. Mild spondylosis of the cervical spine. Critical Value/emergent results were called by telephone at the time of interpretation on 03/10/2018 at 5:55 pm to Dr. Nanda Quinton , who verbally acknowledged these results. Electronically Signed   By: Kristine Garbe M.D.   On: 02/27/2018 18:00   Dg Chest Portable 1 View  Result Date: 02/25/2018 CLINICAL DATA:  Endotracheal tube adjustment. EXAM: PORTABLE CHEST 1 VIEW COMPARISON:  02/25/2018 at 1805 hours FINDINGS: Since the earlier exam, the endotracheal tube has been retracted. Tip now projects 1 cm above the [place]. Nasal/orogastric tube is well positioned passing below the diaphragm into the mid stomach. There is significant improvement in lung aeration on the left following endotracheal tube adjustment. There is persistent opacity at the left lung base consistent  with pneumonia or atelectasis. Right lung remains clear. IMPRESSION: 1. Endotracheal tube is now well-positioned with subsequent significant improvement in left lung aeration. Electronically Signed   By: Lajean Manes M.D.   On: 03/08/2018 19:14   Dg Chest Portable 1 View  Addendum Date: 03/18/2018   ADDENDUM REPORT: 03/02/2018 18:43 ADDENDUM: The original report was by Dr. Van Clines. The following addendum is by Dr. Van Clines: Critical Value/emergent results were called by telephone at the time of interpretation on 03/18/2018 at 6:43 pm to Dr. Nanda Quinton , who verbally acknowledged these results. Electronically Signed   By: Van Clines M.D.   On: 03/17/2018 18:43   Result Date: 03/21/2018 CLINICAL DATA:  Orogastric tube placement EXAM: PORTABLE CHEST 1 VIEW COMPARISON:  02/25/2018 at 5:09 p.m. FINDINGS: The endotracheal tube is just into the right mainstem bronchus. There is interval atelectasis of most of the left lung. Retraction of 5 cm is recommended. An orogastric tube enters the stomach. The right lung appears clear.  Left heart border obscured. IMPRESSION: 1. Right mainstem bronchus intubation, with interval atelectasis of much of the left lung. I recommend retracting the endotracheal tube 5 cm and obtaining a repeat chest radiograph. Radiology assistant personnel have been notified to put me in telephone contact with the referring physician or the referring physician's clinical representative in order to discuss these findings. Once this communication is established I will issue an addendum to this report for documentation purposes. Electronically Signed: By: Van Clines M.D. On: 03/07/2018 18:38   Dg Chest Portable 1 View  Result Date: 03/25/2018 CLINICAL DATA:  Per EMS- pt here from home, was found in a bathtub face up. She was unresponsive and required assisted ventilations with a BVM. Pt now on non rebreather. Hx of ETOH abuse and seizures. No hx of drug abuse. Hx of  HTN. Former smoker EXAM: PORTABLE CHEST 1 VIEW COMPARISON:  None. FINDINGS: Cardiac silhouette is normal in size. No mediastinal or hilar masses. No evidence of adenopathy. Lungs are clear. No gross pleural effusion or pneumothorax on this supine study. Skeletal structures are grossly intact. IMPRESSION: No active disease. Electronically Signed   By: Lajean Manes M.D.   On: 03/18/2018 17:20   Dg Abd Portable 1v  Result Date: 03/07/2018 CLINICAL DATA:  Orogastric tube placement. EXAM: PORTABLE ABDOMEN - 1 VIEW COMPARISON:  None. FINDINGS: Nasogastric tube tip projects in mid stomach. The included bowel gas pattern is normal. No radio-opaque calculi or other significant radiographic abnormality are seen.  Dense LEFT lung consolidation. IMPRESSION: Nasogastric tube tip projects in mid stomach. Electronically Signed   By: Elon Alas M.D.   On: 03/13/2018 18:36    ____________________________________________   PROCEDURES  Procedure(s) performed:   Procedures  CRITICAL CARE Performed by: Margette Fast Total critical care time: 75 minutes Critical care time was exclusive of separately billable procedures and treating other patients. Critical care was necessary to treat or prevent imminent or life-threatening deterioration. Critical care was time spent personally by me on the following activities: development of treatment plan with patient and/or surrogate as well as nursing, discussions with consultants, evaluation of patient's response to treatment, examination of patient, obtaining history from patient or surrogate, ordering and performing treatments and interventions, ordering and review of laboratory studies, ordering and review of radiographic studies, pulse oximetry and re-evaluation of patient's condition.  Nanda Quinton, MD Emergency Medicine   Intubation Procedure Note:   See resident note.  ____________________________________________   INITIAL IMPRESSION / ASSESSMENT AND PLAN  / ED COURSE  Pertinent labs & imaging results that were available during my care of the patient were reviewed by me and considered in my medical decision making (see chart for details).  Patient arrives to the emergency department by EMS with altered mental status and agonal respirations on scene.  Patient is following commands on the right and will move the left to pain only. Unclear if the patient is postictal after a seizure. No known last seen normal time so no CODE STROKE. Patient is not requiring additional respiratory support other than supplemental oxygen.  Patient was taken emergently for head CT which showed large hemorrhage.  Patient was intubated upon return from CT with slight decline in mental status. I spoke with the radiologist who reports basal ganglia hemorrhage with extension into the ventricle.  Patient has associated midline shift.  I paged neurosurgery.   06:20 PM Spoke with Dr. Vertell Limber who reviewed images. He will consult with given the intraparenchymal nature of the bleed, plan for Neurology consultation.   Spoke with Dr. Cheral Marker with Neurology and Dr. Lake Bells with Critical Care. They will consult with Neurology to admit. Titrated medication to reach BP goal less than 875 systolic.  Patient sedated on propofol.  No further intervention after my discussion with both neurosurgery and neurology.  They will both consult on the patient.  Patient does have elevated white blood cell count as well as CK.  Critical care to follow.  No fevers or concern for sepsis in this clinical setting.  Family arrived at bedside and state the patient stopped drinking several days ago after being a very heavy drinker.  She does have a history of seizure in the past but no recent seizure.   Discussed patient's case with Neurology, Dr. Cheral Marker to request admission. Patient and family (if present) updated with plan. Care transferred to Neurology service.  I reviewed all nursing notes, vitals, pertinent old  records, EKGs, labs, imaging (as available).  ____________________________________________  FINAL CLINICAL IMPRESSION(S) / ED DIAGNOSES  Final diagnoses:  Nontraumatic intracerebral hemorrhage, unspecified cerebral location, unspecified laterality ([place])     MEDICATIONS GIVEN DURING THIS VISIT:  Medications  propofol (DIPRIVAN) 1000 MG/100ML infusion (  Not Given 02/25/2018 1818)  clevidipine (CLEVIPREX) infusion 0.5 mg/mL (8 mg/hr Intravenous Rate/Dose Verify 03/15/2018 1909)  etomidate (AMIDATE) injection (20 mg Intravenous Given 03/06/2018 1744)  rocuronium (ZEMURON) injection (100 mg Intravenous Given 03/21/2018 1744)  propofol (DIPRIVAN) 1000 MG/100ML infusion ( Intravenous Canceled Entry 03/05/2018 2015)  insulin aspart (  novoLOG) injection 0-15 Units (has no administration in time range)  fentaNYL (SUBLIMAZE) injection 100 mcg (has no administration in time range)  fentaNYL (SUBLIMAZE) injection 100 mcg (has no administration in time range)  docusate (COLACE) 50 MG/5ML liquid 100 mg (has no administration in time range)  pantoprazole sodium (PROTONIX) 40 mg/20 mL oral suspension 40 mg (has no administration in time range)  thiamine (VITAMIN B-1) tablet 100 mg (has no administration in time range)  folic acid (FOLVITE) tablet 1 mg (has no administration in time range)    Note:  This document was prepared using Dragon voice recognition software and may include unintentional dictation errors.  Nanda Quinton, MD Emergency Medicine    [place]ton Kenley, [facility], MD 03/23/2018 2136

## 2018-02-26 NOTE — ED Triage Notes (Signed)
Per EMS- pt here from home, was found in a bathtub face up. She was unresponsive and required assisted ventilations with a BVM. Pt now on non rebreather. Hx of ETOH abuse and seizures. No hx of drug abuse. No meds given. CBG 225. 20G PIV to right hand. Pt responds to painful stimuli.

## 2018-02-26 NOTE — Progress Notes (Signed)
Pt transported to 4H23 on vent without any complications.  Unit RT at bedside.

## 2018-02-26 NOTE — Progress Notes (Signed)
Patient was intubated for AMS, and airway protection, good color change on ETCO2 detector, good BBS ausculted, SATS 95%, will continue to monitor patient.

## 2018-02-27 ENCOUNTER — Inpatient Hospital Stay: Payer: Self-pay

## 2018-02-27 ENCOUNTER — Inpatient Hospital Stay (HOSPITAL_COMMUNITY): Payer: Managed Care, Other (non HMO)

## 2018-02-27 DIAGNOSIS — N179 Acute kidney failure, unspecified: Secondary | ICD-10-CM

## 2018-02-27 DIAGNOSIS — J9601 Acute respiratory failure with hypoxia: Secondary | ICD-10-CM

## 2018-02-27 DIAGNOSIS — Z9911 Dependence on respirator [ventilator] status: Secondary | ICD-10-CM

## 2018-02-27 DIAGNOSIS — M6282 Rhabdomyolysis: Secondary | ICD-10-CM

## 2018-02-27 DIAGNOSIS — G9341 Metabolic encephalopathy: Secondary | ICD-10-CM

## 2018-02-27 DIAGNOSIS — I610 Nontraumatic intracerebral hemorrhage in hemisphere, subcortical: Principal | ICD-10-CM

## 2018-02-27 LAB — CK
Total CK: 8004 U/L — ABNORMAL HIGH (ref 38–234)
Total CK: 5917 U/L — ABNORMAL HIGH (ref 38–234)
Total CK: 4225 U/L — ABNORMAL HIGH (ref 38–234)

## 2018-02-27 LAB — BASIC METABOLIC PANEL
Anion gap: 16 — ABNORMAL HIGH (ref 5–15)
Anion gap: 6 (ref 5–15)
BUN: 13 mg/dL (ref 6–20)
BUN: 14 mg/dL (ref 6–20)
CO2: 20 mmol/L — ABNORMAL LOW (ref 22–32)
CO2: 22 mmol/L (ref 22–32)
Calcium: 8.8 mg/dL — ABNORMAL LOW (ref 8.9–10.3)
Calcium: 8.2 mg/dL — ABNORMAL LOW (ref 8.9–10.3)
Chloride: 99 mmol/L (ref 98–111)
Chloride: 120 mmol/L — ABNORMAL HIGH (ref 98–111)
Creatinine, Ser: 1.14 mg/dL — ABNORMAL HIGH (ref 0.44–1.00)
Creatinine, Ser: 0.88 mg/dL (ref 0.44–1.00)
GFR calc non Af Amer: 55 mL/min — ABNORMAL LOW (ref >60)
GFR calc non Af Amer: >60 mL/min (ref >60)
Glucose, Bld: 133 mg/dL — ABNORMAL HIGH (ref 70–99)
Glucose, Bld: 106 mg/dL — ABNORMAL HIGH (ref 70–99)
Potassium: 4.1 mmol/L (ref 3.5–5.1)
Potassium: 3.4 mmol/L — ABNORMAL LOW (ref 3.5–5.1)
SODIUM: 148 mmol/L — AB (ref 135–145)
Sodium: 135 mmol/L (ref 135–145)

## 2018-02-27 LAB — CK TOTAL AND CKMB (NOT AT ARMC)
CK, MB: 12.5 ng/mL — AB (ref 0.5–5.0)
Relative Index: 0.5 (ref 0.0–2.5)
Total CK: 2451 U/L — ABNORMAL HIGH (ref 38–234)

## 2018-02-27 LAB — GLUCOSE, CAPILLARY
GLUCOSE-CAPILLARY: 104 mg/dL — AB (ref 70–99)
GLUCOSE-CAPILLARY: 105 mg/dL — AB (ref 70–99)
Glucose-Capillary: 129 mg/dL — ABNORMAL HIGH (ref 70–99)
Glucose-Capillary: 103 mg/dL — ABNORMAL HIGH (ref 70–99)
Glucose-Capillary: 137 mg/dL — ABNORMAL HIGH (ref 70–99)
Glucose-Capillary: 114 mg/dL — ABNORMAL HIGH (ref 70–99)

## 2018-02-27 LAB — LACTIC ACID, PLASMA: Lactic Acid, Venous: 3.9 mmol/L (ref 0.5–1.9)

## 2018-02-27 LAB — TROPONIN I
Troponin I: 1.11 ng/mL (ref <0.03)
Troponin I: 0.83 ng/mL (ref <0.03)

## 2018-02-27 LAB — BETA-HYDROXYBUTYRIC ACID: Beta-Hydroxybutyric Acid: 0.09 mmol/L (ref 0.05–0.27)

## 2018-02-27 LAB — SODIUM
Sodium: 138 mmol/L (ref 135–145)
Sodium: 139 mmol/L (ref 135–145)
Sodium: 143 mmol/L (ref 135–145)

## 2018-02-27 LAB — MRSA PCR SCREENING: MRSA by PCR: NEGATIVE

## 2018-02-27 LAB — TRIGLYCERIDES
Triglycerides: 170 mg/dL — ABNORMAL HIGH (ref <150)
Triglycerides: 217 mg/dL — ABNORMAL HIGH (ref <150)

## 2018-02-27 LAB — HIV ANTIBODY (ROUTINE TESTING W REFLEX): HIV Screen 4th Generation wRfx: NONREACTIVE

## 2018-02-27 MED ORDER — LACTATED RINGERS IV SOLN
INTRAVENOUS | Status: AC
  Administered 2018-02-27: 04:00:00 via INTRAVENOUS

## 2018-02-27 MED ORDER — SODIUM CHLORIDE 0.9% FLUSH
10.0000 mL | Freq: Two times a day (BID) | INTRAVENOUS | Status: DC
  Administered 2018-02-27 – 2018-02-28 (×3): 10 mL
  Administered 2018-02-28 – 2018-03-01 (×2): 20 mL
  Administered 2018-03-01 – 2018-03-07 (×6): 10 mL

## 2018-02-27 MED ORDER — FENTANYL 2500MCG IN NS 250ML (10MCG/ML) PREMIX INFUSION
0.0000 ug/h | INTRAVENOUS | Status: DC
  Administered 2018-02-27: 100 ug/h via INTRAVENOUS
  Administered 2018-02-27: 125 ug/h via INTRAVENOUS
  Administered 2018-03-01: 100 ug/h via INTRAVENOUS
  Administered 2018-03-02: 125 ug/h via INTRAVENOUS
  Administered 2018-03-03 – 2018-03-04 (×2): 150 ug/h via INTRAVENOUS
  Administered 2018-03-05: 100 ug/h via INTRAVENOUS
  Filled 2018-02-27 (×8): qty 250

## 2018-02-27 MED ORDER — SODIUM CHLORIDE 0.9% FLUSH: 10.0000 mL | INTRAVENOUS | Status: DC | PRN

## 2018-02-27 MED ORDER — CHLORHEXIDINE GLUCONATE CLOTH 2 % EX PADS
6.0000 | MEDICATED_PAD | Freq: Every day | CUTANEOUS | Status: DC
  Administered 2018-02-28 – 2018-03-07 (×8): 6 via TOPICAL

## 2018-02-27 MED ORDER — LAMOTRIGINE 100 MG PO TABS
100.0000 mg | ORAL_TABLET | Freq: Two times a day (BID) | ORAL | Status: DC
  Administered 2018-02-27 – 2018-03-07 (×17): 100 mg
  Filled 2018-02-27 (×18): qty 1

## 2018-02-27 MED ORDER — CHLORHEXIDINE GLUCONATE CLOTH 2 % EX PADS: 6.0000 | MEDICATED_PAD | Freq: Every day | CUTANEOUS | Status: DC

## 2018-02-27 MED ORDER — LACTATED RINGERS IV BOLUS
500.0000 mL | Freq: Once | INTRAVENOUS | Status: AC
  Administered 2018-02-27: 500 mL via INTRAVENOUS

## 2018-02-27 MED ORDER — SODIUM CHLORIDE 3 % IV SOLN
INTRAVENOUS | Status: DC
  Administered 2018-02-27 – 2018-02-28 (×4): 80 mL/h via INTRAVENOUS
  Administered 2018-03-02 – 2018-03-04 (×7): 75 mL/h via INTRAVENOUS
  Filled 2018-02-27 (×33): qty 500

## 2018-02-27 NOTE — Progress Notes (Addendum)
NAME:  Jacqueline Pratt, MRN:  [id number], DOB:  Feb 23, 1966, LOS: 1 ADMISSION DATE:  03/04/2018, CONSULTATION DATE:  03/04/2018 REFERRING MD:  Kerney Elbe, CHIEF COMPLAINT:  Found down   Brief History   49F found down, 8 cm basal ganglia hemorrhage. Intubated in ED  History of present illness   53 year old female history of alcohol abuse, hypertension, and seizures who was brought in after being found down. Patient is intubated and sedated. Family present states she was seen by a family member am of admit around 7:30 AM and then her mother talked to her on the phone several times after.  She seemed well at the time with no sick complaints. When the patient's sister went to her home she was in the bathtub.  Her family thought she was intoxicated initially but later noted agonal breathing and nonresponsive lying face up in the bathtub. When EMS arrived glucose 225. Provider in the ED was initially told she was following commands with abnormal speech however later this was refuted by the family and described to be posturing.  In the ED she was intubated for airway protection and admitted to Neurology.   Past Medical History  Alcohol abuse Hypertension Seizures  Significant Hospital Events   1/04 Admit with AMS  Consults:  PCCM  Procedures:  ETT 1/4 >>  Significant Diagnostic Tests:  1/04 CT Head >> 8.4 cm hemorrhage in right basal ganglia with right sided uncal hernation  Micro Data:     Antimicrobials:     Subjective:  RN reports decreased UOP.  1,427 UOP since admit 1/4.  Afebrile. Pt remains on 3%NS, cleviprex weaning off, LR at 150.     Objective   Blood pressure (!) 116/54, pulse 78, temperature 98.6 F (37 C), resp. rate 18, height 5\' 2"  (1.575 m), weight 91.2 kg, SpO2 100 %.    Vent Mode: PRVC FiO2 (%):  [30 %-50 %] 40 % Set Rate:  [18 bmp-20 bmp] 18 bmp Vt Set:  [440 mL-450 mL] 450 mL PEEP:  [5 cmH20] 5 cmH20 Plateau Pressure:  [18 cmH20-27 cmH20] 20 cmH20    Intake/Output Summary (Last 24 hours) at 02/27/2018 1134 Last data filed at 02/27/2018 0900 Gross per 24 hour  Intake 1708.83 ml  Output 1727 ml  Net -18.17 ml   Filed Weights   03/02/2018 2300  Weight: 91.2 kg    Examination: General: adult female lying in bed on vent   HEENT: MM pink/moist, C-Collar in place, ETT Neuro: sedate, pupils 13mm briskly reactive  CV: s1s2 rrr, no m/r/g PULM: even/non-labored, lungs bilaterally diminished but clear  MA:UQJF, non-tender, bsx4 active  Extremities: warm/dry, no edema  Skin: no rashes or lesions   Assessment & Plan:   53 year old female history of alcohol abuse, hypertension, and seizures who was brought in after being found down with unknown last normal. Found to have 8.4 cm right basal ganglia hemorrhage.  Right basal ganglia hemorrhage Malignant hypertension P: Management per Neurology  Family declined surgical interventions  3%NS  Follow sodium, goal 150-155 Cleviprex gtt for BP goals Follow neuro exam  Acute Respiratory Insufficiency in setting of ICH   -intubation for airway protection P: PRVC 8 cc/kg Wean PEEP / FiO2 for sats >90% Follow CXR  No effort 1/5 on SBT (for NP exam)  Anion Gap Metabolic Acidosis -negative beta-hydroxbutyrate, likely AKI P: Trend BMP  AKI Mild Rhabdomyolysis  P: Trend BMP / urinary output Replace electrolytes as indicated Avoid nephrotoxic agents,  ensure adequate renal perfusion Continue LR at 150 ml/hr  Leukocytosis -suspect reactive, afebrile, no infiltrate on CXR. P: Trend WBC, fever curve  Follow cultures, hold abx for now in absence of clear infection   Alcohol abuse P: Continue thiamine, folate  Monitor for evidence of withdrawal  Best practice:  Diet: NPO Pain/Anxiety/Delirium protocol (if indicated): fentanyl VAP protocol (if indicated): ordered DVT prophylaxis: contraindicated with bleed, scd's GI prophylaxis: PPI Glucose control: sliding scale  insulin Mobility: contraindicated at this time Code Status: full code Family Communication: No family at bedside on NP rounds 1/5.  Will update on arrival.   Disposition: ICU under neurology care  Labs   CBC: Recent Labs  Lab 02/24/2018 1716  WBC 25.5*  NEUTROABS 21.5*  HGB 16.5*  17.7*  HCT 50.9*  52.0*  MCV 91.2  PLT 629    Basic Metabolic Panel: Recent Labs  Lab 02/24/2018 1716 03/22/2018 2226 02/27/18 0000 02/27/18 0302 02/27/18 0927  NA 134*  135 135 135 138 139  K 3.4*  3.3*  --  4.1  --   --   CL 98  99  --  99  --   --   CO2 20*  --  20*  --   --   GLUCOSE 267*  244*  --  133*  --   --   BUN 12  13  --  13  --   --   CREATININE 1.18*  0.90  --  1.14*  --   --   CALCIUM 9.3  --  8.8*  --   --    GFR: Estimated Creatinine Clearance: 59.9 mL/min (A) (by C-G formula based on SCr of 1.14 mg/dL (H)). Recent Labs  Lab 03/24/2018 1716 02/27/2018 2120 02/27/18 0000  WBC 25.5*  --   --   LATICACIDVEN  --  4.2* 3.9*    Liver Function Tests: Recent Labs  Lab 03/04/2018 1716  AST 81*  ALT 50*  ALKPHOS 90  BILITOT 0.7  PROT 8.2*  ALBUMIN 4.6   No results for input(s): LIPASE, AMYLASE in the last 168 hours. No results for input(s): AMMONIA in the last 168 hours.  ABG    Component Value Date/Time   HCO3 24.8 03/25/2018 1716   TCO2 24 03/11/2018 1716   TCO2 27 03/01/2018 1716   ACIDBASEDEF 5.0 (H) 03/03/2018 1716   O2SAT 65.0 03/03/2018 1716     Coagulation Profile: Recent Labs  Lab 03/08/2018 1716  INR 0.99    Cardiac Enzymes: Recent Labs  Lab 03/06/2018 1716 02/27/18 0000 02/27/18 0301 02/27/18 0558  CKTOTAL 2,884* 8,004*  --  5,917*  TROPONINI  --  1.11* 0.83*  --     HbA1C: No results found for: HGBA1C  CBG: Recent Labs  Lab 03/20/2018 2343 02/27/18 0356 02/27/18 0841  GLUCAP 132* 104* 129*     Critical care time: 30 minutes      Noe Gens, NP-C  Pulmonary & Critical Care Pgr: [phone] or if no answer  [phone] 02/27/2018, 11:43 AM

## 2018-02-27 NOTE — Progress Notes (Signed)
[facility] Progress Note Patient Name: Jacqueline Pratt DOB: May 09, 1965 MRN: [id number]   Date of Service  02/27/2018  HPI/Events of Note  Triglyceride level = 170 - Patient is on both Propofol and Cleviprex.   eICU Interventions  Will order: 1. D/C Propofol IV infusion. 2. Fentanyl IV infusion. Titrate to RASS = 0 to -1.  3. Repeat Triglyceride level at 11 AM.     Intervention Category Intermediate Interventions: Diagnostic test evaluation  Cristie Mckinney [place] 02/27/2018, 1:40 AM

## 2018-02-27 NOTE — Progress Notes (Addendum)
Peripherally Inserted Central Catheter/Midline Placement  The IV Nurse has discussed with the patient and/or persons authorized to consent for the patient, the purpose of this procedure and the potential benefits and risks involved with this procedure.  The benefits include less needle sticks, lab draws from the catheter, and the patient may be discharged home with the catheter. Risks include, but not limited to, infection, bleeding, blood clot (thrombus formation), and puncture of an artery; nerve damage and irregular heartbeat and possibility to perform a PICC exchange if needed/ordered by physician.  Alternatives to this procedure were also discussed.  Bard Power PICC patient education guide, fact sheet on infection prevention and patient information card has been provided to patient /or left at bedside.  Telephone consent obtained from sister.  PICC/Midline Placement Documentation  PICC Double Lumen 02/27/18 PICC Right Cephalic 38 cm 1 cm (Active)  Indication for Insertion or Continuance of Line Limited venous access - need for IV therapy >5 days (PICC only) 02/27/2018 11:00 AM  Exposed Catheter (cm) 1 cm 02/27/2018 11:00 AM  Site Assessment Clean;Dry;Intact 02/27/2018 11:00 AM  Lumen #1 Status Flushed;Blood return noted;Saline locked 02/27/2018 11:00 AM  Lumen #2 Status Flushed;Blood return noted;Saline locked 02/27/2018 11:00 AM  Dressing Type Transparent 02/27/2018 11:00 AM  Dressing Status Clean;Dry;Intact;Antimicrobial disc in place 02/27/2018 11:00 AM  Line Care Connections checked and tightened 02/27/2018 11:00 AM  Dressing Intervention New dressing;Antimicrobial disc changed 02/27/2018 11:00 AM  Dressing Change Due 03/06/18 02/27/2018 11:00 AM       Lorenza Cambridge 02/27/2018, 12:03 PM

## 2018-02-27 NOTE — Progress Notes (Signed)
PT Cancellation Note  Patient Details Name: Jacqueline Pratt MRN: [id number] DOB: [birth year]   Cancelled Treatment:    Reason Eval/Treat Not Completed: Patient not medically ready(active bedrest on vent)   Zeanna Sunde B Kaliq Lege 02/27/2018, 7:25 AM Elwyn Reach, PT Acute Rehabilitation Services Pager: [phone] Office: [phone]

## 2018-02-27 NOTE — Progress Notes (Addendum)
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 On EMS arrival, she was unresponsive, incontinent of stool and had labored respirations, requiring assisted ventilations with a BVM.Marland Kitchen   02/27/18 She is accompanied by her sister, and niece for support today at the bedside. She remains with sedation. Family is educated at the bedside about indeterminate etiology for ICH. As BP has not been significantly elevated  Her sister states that the patient had been placed on 5 different antihypertensive medications for goal of control.  She is on Lamictal for seizure ay home which is restarted today. BP is well controlled. Hypertonic saline started last night. Neurosurgery declined clot evacuation. Re[eat Ct this am shows stable hematoma and mass effect and transfalcine herniation   Past Medical History:  Diagnosis Date  . Anxiety   . Depression   . Hypertension   . Migraines   . Seizures ([place])    last seizure 3 years ago per pt    Past Surgical History:  Procedure Laterality Date  . EXAM UNDER ANESTHESIA WITH MANIPULATION OF SHOULDER    . INCISION AND DRAINAGE     left lower side- staph infection  . STAPEDES SURGERY      Family History  Problem Relation Age of Onset  . Diabetes Mother   . Non-Hodgkin's lymphoma Sister   . Rectal cancer Maternal Grandfather   . Hypertension Maternal Grandfather   . Diabetes Maternal Grandfather   . Colon cancer Neg Hx    Social History:  reports that she has quit smoking. She has never used smokeless tobacco. She reports current alcohol use of about 14.0 standard drinks of alcohol per week. She reports that she does not use  drugs.  Allergies:  Allergies  Allergen Reactions  . Aspirin Other (See Comments) and Nausea And Vomiting  . Lovastatin Diarrhea  . Simvastatin Diarrhea  . Statins Other (See Comments)    Diarrhea   . Tetracyclines & Related Nausea Only    Home Medications   ROS: Unable to obtain due to sedation  Physical Examination: Blood pressure (!) 106/50, pulse 62, temperature 98.6 F (37 C), resp. rate 18, height 5\' 2"  (1.575 m), weight 91.2 kg, SpO2 99 %.  HEENT-  [place]/AT  Lungs - Intubated on ventilator Extremities - Warm and well perfused  Neurologic Examination: Mental Status: Intubated and sedated on propofol. No eye opening spontaneously or to command. Flails RUE and RLE to sternal rub. Occasional extensor posturing. No response to voice.  Cranial Nerves: II:  Pupils 56mm bilaterally and unreactive (sedated)- left slugglish. No blink to threat.  Fundi not visualized III,IV, VI: No doll's eye reflex (sedated). Positive corneal reflexes  VII: Face flaccidly symmetric.  VIII: No response to voice IX,X: Intubated XI: Unable to assess XII: positive gag with introduction of inline cath. Mildly delayed Motor/Sensory: RUE: Flails nonpurposefully to noxious stimuli.  RLE: Withdraws to noxious and flails nonpurposefully intermittently LUE: Increased extensor tone.no movement with noxious stimuli LLE: Increased extensor tone. Intermittent extension, adduction and internal rotation.  Muscle bulk is normal x 4  Cerebellar/Gait: Unable to assess   Results for orders placed or performed during the hospital encounter of 03/08/2018 (from the past 48 hour(s))  I-stat troponin, ED     Status: Abnormal   Collection Time: 03/18/2018  5:14 PM  Result Value Ref Range   Troponin i, poc 0.30 (HH) 0.00 - 0.08 ng/mL   Comment NOTIFIED PHYSICIAN    Comment 3            Comment: Due to the release kinetics of cTnI, a negative result within the first hours of the onset of symptoms does not rule  out myocardial infarction with certainty. If myocardial infarction is still suspected, repeat the test at appropriate intervals.   I-Stat beta hCG blood, ED     Status: None   Collection Time: 02/23/2018  5:15 PM  Result Value Ref Range   I-stat hCG, quantitative <5.0 <5 mIU/mL   Comment 3            Comment:   GEST. AGE      CONC.  (mIU/mL)   <=1 WEEK        5 - 50     2 WEEKS       50 - 500     3 WEEKS       100 - 10,000     4 WEEKS     1,000 - 30,000        FEMALE AND NON-PREGNANT FEMALE:     LESS THAN 5 mIU/mL   I-Stat Chem 8, ED     Status: Abnormal   Collection Time: 03/13/2018  5:16 PM  Result Value Ref Range   Sodium 135 135 - 145 mmol/L   Potassium 3.3 (L) 3.5 - 5.1 mmol/L   Chloride 99 98 - 111 mmol/L   BUN 13 6 - 20 mg/dL   Creatinine, Ser 0.90 0.44 - 1.00 mg/dL   Glucose, Bld 244 (H) 70 - 99 mg/dL   Calcium, Ion 1.19 1.15 - 1.40 mmol/L   TCO2 24 22 - 32 mmol/L   Hemoglobin 17.7 (H) 12.0 - 15.0 g/dL   HCT 52.0 (H) 36.0 - 46.0 %  Ethanol     Status: None   Collection Time: 03/22/2018  5:16 PM  Result Value Ref Range   Alcohol, Ethyl (B) <10 <10 mg/dL    Comment: (NOTE) Lowest detectable limit for serum alcohol is 10 mg/dL. For medical purposes only. Performed at [facility], [place] [address], [place], [place] [address]   Protime-INR     Status: None   Collection Time: 03/06/2018  5:16 PM  Result Value Ref Range   Prothrombin Time 13.0 11.4 - 15.2 seconds   INR 0.99     Comment: Performed at [facility] [address], [place], [place] [address]  APTT     Status: None   Collection Time: 02/24/2018  5:16 PM  Result Value Ref Range   aPTT 24 24 - 36 seconds    Comment: Performed at [facility] [address], [place], [place] [address]  CBC     Status: Abnormal   Collection Time: 02/24/2018  5:16 PM  Result Value Ref Range   WBC 25.5 (H) 4.0 - 10.5 K/uL   RBC 5.58 (H) 3.87 - 5.11 MIL/uL   Hemoglobin 16.5 (H) 12.0 - 15.0 g/dL   HCT 50.9 (H) 36.0  - 46.0 %   MCV 91.2 80.0 - 100.0 fL   MCH 29.6 26.0 - 34.0 pg   MCHC 32.4 30.0 - 36.0 g/dL   RDW 12.2 11.5 - 15.5 %   Platelets 305 150 -  400 K/uL   nRBC 0.0 0.0 - 0.2 %    Comment: Performed at [facility], [place] [address] [place], [place] [address]  Differential     Status: Abnormal   Collection Time: 03/05/2018  5:16 PM  Result Value Ref Range   Neutrophils Relative % 83 %   Neutro Abs 21.5 (H) 1.7 - 7.7 K/uL   Lymphocytes Relative 11 %   Lymphs Abs 2.7 0.7 - 4.0 K/uL   Monocytes Relative 4 %   Monocytes Absolute 1.0 0.1 - 1.0 K/uL   Eosinophils Relative 0 %   Eosinophils Absolute 0.0 0.0 - 0.5 K/uL   Basophils Relative 1 %   Basophils Absolute 0.1 0.0 - 0.1 K/uL   RBC Morphology MORPHOLOGY UNREMARKABLE    Immature Granulocytes 1 %   Abs Immature Granulocytes 0.24 (H) 0.00 - 0.07 K/uL    Comment: Performed at [facility] [address], [place], [place] [address]  Comprehensive metabolic panel     Status: Abnormal   Collection Time: 03/22/2018  5:16 PM  Result Value Ref Range   Sodium 134 (L) 135 - 145 mmol/L   Potassium 3.4 (L) 3.5 - 5.1 mmol/L   Chloride 98 98 - 111 mmol/L   CO2 20 (L) 22 - 32 mmol/L   Glucose, Bld 267 (H) 70 - 99 mg/dL   BUN 12 6 - 20 mg/dL   Creatinine, Ser 1.18 (H) 0.44 - 1.00 mg/dL   Calcium 9.3 8.9 - 10.3 mg/dL   Total Protein 8.2 (H) 6.5 - 8.1 g/dL   Albumin 4.6 3.5 - 5.0 g/dL   AST 81 (H) 15 - 41 U/L   ALT 50 (H) 0 - 44 U/L   Alkaline Phosphatase 90 38 - 126 U/L   Total Bilirubin 0.7 0.3 - 1.2 mg/dL   GFR calc non Af Amer 53 (L) >60 mL/min   GFR calc Af Amer >60 >60 mL/min   Anion gap 16 (H) 5 - 15    Comment: Performed at [facility], [place] [address] [place], [place] [address]  CK     Status: Abnormal   Collection Time: 03/22/2018  5:16 PM  Result Value Ref Range   Total CK 2,884 (H) 38 - 234 U/L    Comment: Performed at [facility], [place] [address] [place], [place] [address]  I-Stat venous blood gas, ED      Status: Abnormal   Collection Time: 02/25/2018  5:16 PM  Result Value Ref Range   pH, Ven 7.209 (L) 7.250 - 7.430   pCO2, Ven 62.0 (H) 44.0 - 60.0 mmHg   pO2, Ven 42.0 32.0 - 45.0 mmHg   Bicarbonate 24.8 20.0 - 28.0 mmol/L   TCO2 27 22 - 32 mmol/L   O2 Saturation 65.0 %   Acid-base deficit 5.0 (H) 0.0 - 2.0 mmol/L   Patient temperature HIDE    Sample type VENOUS   Lactic acid, plasma     Status: Abnormal   Collection Time: 02/25/2018  9:20 PM  Result Value Ref Range   Lactic Acid, Venous 4.2 (HH) 0.5 - 1.9 mmol/L    Comment: CRITICAL RESULT CALLED TO, READ BACK BY AND VERIFIED WITH: DICKSON G,RN 03/08/2018 2202 WAYK Performed at [facility], [place] [address] [place],  [address]   Urine rapid drug screen (hosp performed)     Status: None   Collection Time: 03/15/2018  9:29 PM  Result Value Ref Range  Opiates NONE DETECTED NONE DETECTED   Cocaine NONE DETECTED NONE DETECTED   Benzodiazepines NONE DETECTED NONE DETECTED   Amphetamines NONE DETECTED NONE DETECTED   Tetrahydrocannabinol NONE DETECTED NONE DETECTED   Barbiturates NONE DETECTED NONE DETECTED    Comment: (NOTE) DRUG SCREEN FOR MEDICAL PURPOSES ONLY.  IF CONFIRMATION IS NEEDED FOR ANY PURPOSE, NOTIFY LAB WITHIN 5 DAYS. LOWEST DETECTABLE LIMITS FOR URINE DRUG SCREEN Drug Class                     Cutoff (ng/mL) Amphetamine and metabolites    1000 Barbiturate and metabolites    200 Benzodiazepine                 093 Tricyclics and metabolites     300 Opiates and metabolites        300 Cocaine and metabolites        300 THC                            50 Performed at [facility], [place] [address] [place], [place] [address]   Urinalysis, Routine w reflex microscopic     Status: Abnormal   Collection Time: 03/10/2018  9:29 PM  Result Value Ref Range   Color, Urine YELLOW YELLOW   APPearance CLEAR CLEAR   Specific Gravity, Urine 1.012 1.005 - 1.030   pH 7.0 5.0 - 8.0   Glucose, UA 150 (A) NEGATIVE  mg/dL   Hgb urine dipstick LARGE (A) NEGATIVE   Bilirubin Urine NEGATIVE NEGATIVE   Ketones, ur 5 (A) NEGATIVE mg/dL   Protein, ur 100 (A) NEGATIVE mg/dL   Nitrite NEGATIVE NEGATIVE   Leukocytes, UA NEGATIVE NEGATIVE   RBC / HPF 6-10 0 - 5 RBC/hpf   WBC, UA 0-5 0 - 5 WBC/hpf   Bacteria, UA NONE SEEN NONE SEEN   Squamous Epithelial / LPF 0-5 0 - 5   Mucus PRESENT     Comment: Performed at [facility], [address], [place], [place] [address]  MRSA PCR Screening     Status: None   Collection Time: 03/19/2018 10:08 PM  Result Value Ref Range   MRSA by PCR NEGATIVE NEGATIVE    Comment:        The GeneXpert MRSA Assay (FDA approved for NASAL specimens only), is one component of a comprehensive MRSA colonization surveillance program. It is not intended to diagnose MRSA infection nor to guide or monitor treatment for MRSA infections. Performed at [facility], [place] [address] [place], [place] [address]   HIV antibody (Routine Testing)     Status: None   Collection Time: 02/25/2018 10:26 PM  Result Value Ref Range   HIV Screen 4th Generation wRfx Non Reactive Non Reactive    Comment: (NOTE) Performed At: [facility] [place], [place] [id number] Rush Farmer MD HA:[id number]   Sodium     Status: None   Collection Time: 02/28/2018 10:26 PM  Result Value Ref Range   Sodium 135 135 - 145 mmol/L    Comment: Performed at [facility], [place] [address], [place],  [address]  Glucose, capillary     Status: Abnormal   Collection Time: 03/11/2018 11:43 PM  Result Value Ref Range   Glucose-Capillary 132 (H) 70 - 99 mg/dL  Basic metabolic panel     Status: Abnormal   Collection Time: 02/27/18 12:00 AM  Result Value Ref Range   Sodium 135 135 - 145 mmol/L   Potassium 4.1 3.5 - 5.1 mmol/L   Chloride 99 98 - 111 mmol/L   CO2 20 (L) 22 - 32 mmol/L   Glucose, Bld 133 (H) 70 - 99 mg/dL   BUN 13 6 - 20 mg/dL   Creatinine, Ser 1.14 (H) 0.44 - 1.00  mg/dL   Calcium 8.8 (L) 8.9 - 10.3 mg/dL   GFR calc non Af Amer 55 (L) >60 mL/min   GFR calc Af Amer >60 >60 mL/min   Anion gap 16 (H) 5 - 15    Comment: Performed at [facility] [address], [place], [place] [address]  Lactic acid, plasma     Status: Abnormal   Collection Time: 02/27/18 12:00 AM  Result Value Ref Range   Lactic Acid, Venous 3.9 (HH) 0.5 - 1.9 mmol/L    Comment: CRITICAL RESULT CALLED TO, READ BACK BY AND VERIFIED WITH: DICKSON G,RN 02/27/18 0115 WAYK Performed at [facility], [place] [address] [place], [place] [address]   Triglycerides     Status: Abnormal   Collection Time: 02/27/18 12:00 AM  Result Value Ref Range   Triglycerides 170 (H) <150 mg/dL    Comment: Performed at [facility] [address] [place], [place] [address]  Troponin I - Now Then Q6H     Status: Abnormal   Collection Time: 02/27/18 12:00 AM  Result Value Ref Range   Troponin I 1.11 (HH) <0.03 ng/mL    Comment: CRITICAL RESULT CALLED TO, READ BACK BY AND VERIFIED WITH: [facility] N,RN 02/27/18 0125 WAYK Performed at [facility], [place] [address], [place], [place] [address]   CK     Status: Abnormal   Collection Time: 02/27/18 12:00 AM  Result Value Ref Range   Total CK 8,004 (H) 38 - 234 U/L    Comment: RESULTS CONFIRMED BY MANUAL DILUTION Performed at [facility], [place] [address] [place], [place] [address]   Troponin I - Now Then Q6H     Status: Abnormal   Collection Time: 02/27/18  3:01 AM  Result Value Ref Range   Troponin I 0.83 (HH) <0.03 ng/mL    Comment: CRITICAL VALUE NOTED.  VALUE IS CONSISTENT WITH PREVIOUSLY REPORTED AND CALLED VALUE. Performed at [facility], [place] [address] [place], [place] [address]   Sodium     Status: None   Collection Time: 02/27/18  3:02 AM  Result Value Ref Range   Sodium 138 135 - 145 mmol/L    Comment: Performed at [facility], [place] [address] [place],  [address]  Beta-hydroxybutyric acid      Status: None   Collection Time: 02/27/18  3:02 AM  Result Value Ref Range   Beta-Hydroxybutyric Acid 0.09 0.05 - 0.27 mmol/L    Comment: Performed at [facility] [address], [place], [place] [address]  Glucose, capillary     Status: Abnormal   Collection Time: 02/27/18  3:56 AM  Result Value Ref Range   Glucose-Capillary 104 (H) 70 - 99 mg/dL  CK     Status: Abnormal   Collection Time: 02/27/18  5:58 AM  Result Value Ref Range   Total CK 5,917 (H) 38 - 234 U/L    Comment: RESULTS CONFIRMED BY MANUAL DILUTION Performed at [facility], [place] [address] [place], [place] [address]   Glucose, capillary  Status: Abnormal   Collection Time: 02/27/18  8:41 AM  Result Value Ref Range   Glucose-Capillary 129 (H) 70 - 99 mg/dL   Comment 1 Notify RN    Comment 2 Document in Chart   Sodium     Status: None   Collection Time: 02/27/18  9:27 AM  Result Value Ref Range   Sodium 139 135 - 145 mmol/L    Comment: Performed at [facility], [place] [address], [place], [place] [address]  Triglycerides     Status: Abnormal   Collection Time: 02/27/18  9:27 AM  Result Value Ref Range   Triglycerides 217 (H) <150 mg/dL    Comment: Performed at [facility] [address], [place], [place] [address]  CK     Status: Abnormal   Collection Time: 02/27/18 10:59 AM  Result Value Ref Range   Total CK 4,225 (H) 38 - 234 U/L    Comment: RESULTS CONFIRMED BY MANUAL DILUTION Performed at [facility], [place] [address] [place], [place] [address]   Glucose, capillary     Status: Abnormal   Collection Time: 02/27/18 12:14 PM  Result Value Ref Range   Glucose-Capillary 103 (H) 70 - 99 mg/dL   Comment 1 Notify RN    Comment 2 Document in Chart    Ct Head Wo Contrast  Result Date: 02/27/2018 CLINICAL DATA:  Follow-up examination for intracranial hemorrhage. EXAM: CT HEAD WITHOUT CONTRAST TECHNIQUE: Contiguous axial images were obtained from the base of the skull through the  vertex without intravenous contrast. COMPARISON:  Prior CT from 02/28/2018. FINDINGS: Brain: Large intraparenchymal hemorrhage centered at the right basal ganglia again seen, measuring 8.6 x 3.8 x 4.4 cm (estimated volume 71.9 cc, previously approximately 75 cc). Surrounding low-density vasogenic edema with regional mass effect with up to 10 mm of right-to-left midline shift, relatively similar to previous. Partial basilar cistern effacement similar to previous. Intraventricular extension with blood primarily in the lateral and third ventricles, also similar to previous. Stable ventricular size and morphology without worsening hydrocephalus or ventricular trapping. Remainder of the brain is stable in appearance. No new intracranial hemorrhage. No other acute large vessel territory infarct. No extra-axial collection. Vascular: No asymmetric hyperdense vessel. Skull: Small evolving left parietotemporal scalp contusion calvarium intact. Sinuses/Orbits: Globes and orbital soft tissues demonstrate no acute finding. Scattered mucosal thickening with air-fluid levels noted within the paranasal sinuses. Patient likely intubated. Mastoid air cells remain clear. Other: None. IMPRESSION: 1. Similar to slightly decreased size of large intraparenchymal hematoma centered at the right basal ganglia, estimated volume 71.9 cc on current exam, previously 75 cc. 2. Similar regional mass effect with up to 10 mm of right-to-left midline shift. 3. Associated intraventricular extension with blood primarily in the lateral and third ventricles, similar to previous. 4. No other new intracranial abnormality. Electronically Signed   By: Jeannine Boga M.D.   On: 02/27/2018 02:59   Ct Head Wo Contrast  Result Date: 03/08/2018 CLINICAL DATA:  53 y/o F; altered level of consciousness. Found unresponsive in a bathtub. EXAM: CT HEAD WITHOUT CONTRAST CT CERVICAL SPINE WITHOUT CONTRAST TECHNIQUE: Multidetector CT imaging of the head and  cervical spine was performed following the standard protocol without intravenous contrast. Multiplanar CT image reconstructions of the cervical spine were also generated. COMPARISON:  01/09/2016 cervical spine radiographs FINDINGS: CT HEAD FINDINGS Brain: Acute hemorrhage centered in the right basal ganglia with extension into the right cerebral hemisphere measuring 8.4 x 3.9 x 4.6  cm (volume = 79 cm^3) (AP x ML x CC series 4, image 24 and series 6, image 38). There is extension of the hematoma into the intraventricular system, predominantly the right lateral ventricle. Associated edema and mass effect results in 10 mm of right-to-left midline shift, right-sided uncal herniation, and partial effacement of the basilar cisterns. No transtentorial or foramen magnum herniation. Vascular: No hyperdense vessel or unexpected calcification. Skull: Normal. Negative for fracture or focal lesion. Sinuses/Orbits: Small paranasal sinus fluid levels. Normal aeration of mastoid air cells. Orbits are unremarkable. Other: None. CT CERVICAL SPINE FINDINGS Alignment: Normal. Skull base and vertebrae: No acute fracture. No primary bone lesion or focal pathologic process. Soft tissues and spinal canal: No prevertebral fluid or swelling. No visible canal hematoma. Disc levels: Mild spondylosis of the cervical spine with predominantly discogenic degenerative changes greatest at the C5-C7 levels. Upper chest: Negative. Other: 15 mm nodule within the left lobe of the thyroid gland. IMPRESSION: CT head: 1. Acute hemorrhage centered in right basal ganglia with extension into right cerebral hemisphere measuring up to 8.4 cm, 79 CC. 2. Extension of hemorrhage into the intraventricular system, greatest in the right lateral ventricle. 3. Mass effect with 10 mm right-to-left midline shift, right-sided uncal herniation, and partial effacement of basilar cisterns. No downward herniation at this time. CT cervical spine. 1. No acute fracture or  dislocation of the cervical spine. 2. 15 mm nodule within left lobe of thyroid gland. Further evaluation with thyroid ultrasound is recommended on a nonemergent basis. 3. Mild spondylosis of the cervical spine. Critical Value/emergent results were called by telephone at the time of interpretation on 03/08/2018 at 5:55 pm to Dr. Nanda Quinton , who verbally acknowledged these results. Electronically Signed   By: Kristine Garbe M.D.   On: 03/14/2018 18:00   Ct Cervical Spine Wo Contrast  Result Date: 03/03/2018 CLINICAL DATA:  53 y/o F; altered level of consciousness. Found unresponsive in a bathtub. EXAM: CT HEAD WITHOUT CONTRAST CT CERVICAL SPINE WITHOUT CONTRAST TECHNIQUE: Multidetector CT imaging of the head and cervical spine was performed following the standard protocol without intravenous contrast. Multiplanar CT image reconstructions of the cervical spine were also generated. COMPARISON:  01/09/2016 cervical spine radiographs FINDINGS: CT HEAD FINDINGS Brain: Acute hemorrhage centered in the right basal ganglia with extension into the right cerebral hemisphere measuring 8.4 x 3.9 x 4.6 cm (volume = 79 cm^3) (AP x ML x CC series 4, image 24 and series 6, image 38). There is extension of the hematoma into the intraventricular system, predominantly the right lateral ventricle. Associated edema and mass effect results in 10 mm of right-to-left midline shift, right-sided uncal herniation, and partial effacement of the basilar cisterns. No transtentorial or foramen magnum herniation. Vascular: No hyperdense vessel or unexpected calcification. Skull: Normal. Negative for fracture or focal lesion. Sinuses/Orbits: Small paranasal sinus fluid levels. Normal aeration of mastoid air cells. Orbits are unremarkable. Other: None. CT CERVICAL SPINE FINDINGS Alignment: Normal. Skull base and vertebrae: No acute fracture. No primary bone lesion or focal pathologic process. Soft tissues and spinal canal: No prevertebral  fluid or swelling. No visible canal hematoma. Disc levels: Mild spondylosis of the cervical spine with predominantly discogenic degenerative changes greatest at the C5-C7 levels. Upper chest: Negative. Other: 15 mm nodule within the left lobe of the thyroid gland. IMPRESSION: CT head: 1. Acute hemorrhage centered in right basal ganglia with extension into right cerebral hemisphere measuring up to 8.4 cm, 79 CC. 2. Extension of hemorrhage into the  intraventricular system, greatest in the right lateral ventricle. 3. Mass effect with 10 mm right-to-left midline shift, right-sided uncal herniation, and partial effacement of basilar cisterns. No downward herniation at this time. CT cervical spine. 1. No acute fracture or dislocation of the cervical spine. 2. 15 mm nodule within left lobe of thyroid gland. Further evaluation with thyroid ultrasound is recommended on a nonemergent basis. 3. Mild spondylosis of the cervical spine. Critical Value/emergent results were called by telephone at the time of interpretation on 03/08/2018 at 5:55 pm to Dr. Nanda Quinton , who verbally acknowledged these results. Electronically Signed   By: Kristine Garbe M.D.   On: 02/27/2018 18:00   Dg Chest Portable 1 View  Result Date: 02/24/2018 CLINICAL DATA:  Endotracheal tube adjustment. EXAM: PORTABLE CHEST 1 VIEW COMPARISON:  03/14/2018 at 1805 hours FINDINGS: Since the earlier exam, the endotracheal tube has been retracted. Tip now projects 1 cm above the [place]. Nasal/orogastric tube is well positioned passing below the diaphragm into the mid stomach. There is significant improvement in lung aeration on the left following endotracheal tube adjustment. There is persistent opacity at the left lung base consistent with pneumonia or atelectasis. Right lung remains clear. IMPRESSION: 1. Endotracheal tube is now well-positioned with subsequent significant improvement in left lung aeration. Electronically Signed   By: Lajean Manes M.D.    On: 03/10/2018 19:14   Dg Chest Portable 1 View  Addendum Date: 03/21/2018   ADDENDUM REPORT: 03/05/2018 18:43 ADDENDUM: The original report was by Dr. Van Clines. The following addendum is by Dr. Van Clines: Critical Value/emergent results were called by telephone at the time of interpretation on 03/10/2018 at 6:43 pm to Dr. Nanda Quinton , who verbally acknowledged these results. Electronically Signed   By: Van Clines M.D.   On: 03/22/2018 18:43   Result Date: 02/25/2018 CLINICAL DATA:  Orogastric tube placement EXAM: PORTABLE CHEST 1 VIEW COMPARISON:  03/17/2018 at 5:09 p.m. FINDINGS: The endotracheal tube is just into the right mainstem bronchus. There is interval atelectasis of most of the left lung. Retraction of 5 cm is recommended. An orogastric tube enters the stomach. The right lung appears clear.  Left heart border obscured. IMPRESSION: 1. Right mainstem bronchus intubation, with interval atelectasis of much of the left lung. I recommend retracting the endotracheal tube 5 cm and obtaining a repeat chest radiograph. Radiology assistant personnel have been notified to put me in telephone contact with the referring physician or the referring physician's clinical representative in order to discuss these findings. Once this communication is established I will issue an addendum to this report for documentation purposes. Electronically Signed: By: Van Clines M.D. On: 03/02/2018 18:38   Dg Chest Portable 1 View  Result Date: 02/23/2018 CLINICAL DATA:  Per EMS- pt here from home, was found in a bathtub face up. She was unresponsive and required assisted ventilations with a BVM. Pt now on non rebreather. Hx of ETOH abuse and seizures. No hx of drug abuse. Hx of HTN. Former smoker EXAM: PORTABLE CHEST 1 VIEW COMPARISON:  None. FINDINGS: Cardiac silhouette is normal in size. No mediastinal or hilar masses. No evidence of adenopathy. Lungs are clear. No gross pleural effusion or  pneumothorax on this supine study. Skeletal structures are grossly intact. IMPRESSION: No active disease. Electronically Signed   By: Lajean Manes M.D.   On: 03/06/2018 17:20   Dg Abd Portable 1v  Result Date: 03/16/2018 CLINICAL DATA:  Orogastric tube placement. EXAM: PORTABLE ABDOMEN - 1 VIEW  COMPARISON:  None. FINDINGS: Nasogastric tube tip projects in mid stomach. The included bowel gas pattern is normal. No radio-opaque calculi or other significant radiographic abnormality are seen. Dense LEFT lung consolidation. IMPRESSION: Nasogastric tube tip projects in mid stomach. Electronically Signed   By: Elon Alas M.D.   On: 03/12/2018 18:36   [place] Ekg Site Rite  Result Date: 02/27/2018 If Site Rite image not attached, placement could not be confirmed due to current cardiac rhythm.  CT head: 1. Acute hemorrhage centered in right basal ganglia with extension into right cerebral hemisphere measuring up to 8.4 cm, 79 CC. 2. Extension of hemorrhage into the intraventricular system, greatest in the right lateral ventricle. 3. Mass effect with 10 mm right-to-left midline shift, right-sided uncal herniation, and partial effacement of basilar cisterns. No downward herniation at this time.  CT cervical spine. 1. No acute fracture or dislocation of the cervical spine. 2. 15 mm nodule within left lobe of thyroid gland. Further evaluation with thyroid ultrasound is recommended on a nonemergent basis. 3. Mild spondylosis of the cervical spine.  CT brain repeat 02/27/18 1. Similar to slightly decreased size of large intraparenchymal hematoma centered at the right basal ganglia, estimated volume 71.9 cc on current exam, previously 75 cc. 2. Similar regional mass effect with up to 10 mm of right-to-left midline shift. 3. Associated intraventricular extension with blood primarily in the lateral and third ventricles, similar to previous. 4. No other new intracranial abnormality.  Assessment: 53  y.o. female with acute right basal ganglia hemorrhage etiology likely hypertensive 1. The hemorrhage measures 75 cc=-slightly improved There is extension into the right cerebral hemisphere and ventricles as well as mass effect with 10 mm right to left midline shift, right sided uncal herniation and partial effacement of the basilar cisterns.  2. Was in respiratory acidosis on arrival. Now intubated on ventilator.  3. Elevated CK 4,225, consistent with rhabdomyolysis 4. Elevated troponin of 0.3- EKG  5. Leukocytosis of 25.5, lactic acid 4.2  Recommendations: 1. Admitted to the ICU under the Neurology service with CCM consulting for ventilator and medical management 2. Order home Lamictal for seizures  3. 3% saline at 50 cc/hr with goal Na of 150-155, now oliguric Place PICC line and increase rate to 80 cc/hrand follow Na 6 hrly for goal 150-155 4. Discussed with family. She is currently a full code with maximal management 5. Cardiac telemetry 6. Frequent neuro checks 7. BP management with clevidipine drip SBP goal below 140 x 24 hrs and then below 160 8. No antiplatelet medications or anticoagulants. DVT prophylaxis with SCDs 9. CT angio tomorrow  I have personally obtained history,examined this patient, reviewed notes, independently viewed imaging studies, participated in medical decision making and plan of care.ROS completed by me personally and pertinent positives fully documented  I have made any additions or clarifications directly to the above note. Agree with note above. Long d/w sister at bedside and answered questions about prognosis. D/w Dr Vertell Limber and Icard This patient is critically ill and at significant risk of neurological worsening, death and care requires constant monitoring of vital signs, hemodynamics,respiratory and cardiac monitoring, extensive review of multiple databases, frequent neurological assessment, discussion with family, other specialists and medical decision making of  high complexity.I have made any additions or clarifications directly to the above note.This critical care time does not reflect procedure time, or teaching time or supervisory time of PA/NP/Med Resident etc but could involve care discussion time.  I spent 50 minutes of neurocritical care  time  in the care of  this patient.     Antony Contras, MD Medical Director [facility] Pager: [phone] 02/27/2018 5:44 PM  02/27/2018, 3:43 PM

## 2018-02-27 NOTE — Progress Notes (Signed)
[place] Progress Note Patient Name: Jacqueline Pratt DOB: 1965/06/06 MRN: [id number]   Date of Service  02/27/2018  HPI/Events of Note  Troponin = 1.11 in setting Basal Ganglia ICH. EKG: Sinus rhythm Atrial premature complex. Probable LVH with secondary repol abnrm ST depr, consider ischemia, inferior leads Artifact in lead(s) I II III aVR aVL V1. Demand ischemia? Can't Heparinize or use ASA d/t Basal Ganlia ICH  eICU Interventions  Continue to trend Troponin.        Sheryl Saintil Eugene 02/27/2018, 1:54 AM

## 2018-02-27 NOTE — Progress Notes (Signed)
Spoke with CCM about patient's low urine output all shift ( less than 23cc/hr). MD states we will continue to monitor, no intervention at this time.   Bladder scan obtained to rule out obstruction with no urine visible in bladder.

## 2018-02-27 NOTE — Progress Notes (Deleted)
Peripherally Inserted Central Catheter/Midline Placement  The IV Nurse has discussed with the patient and/or persons authorized to consent for the patient, the purpose of this procedure and the potential benefits and risks involved with this procedure.  The benefits include less needle sticks, lab draws from the catheter, and the patient may be discharged home with the catheter. Risks include, but not limited to, infection, bleeding, blood clot (thrombus formation), and puncture of an artery; nerve damage and irregular heartbeat and possibility to perform a PICC exchange if needed/ordered by physician.  Alternatives to this procedure were also discussed.  Bard Power PICC patient education guide, fact sheet on infection prevention and patient information card has been provided to patient /or left at bedside.    PICC/Midline Placement Documentation  PICC Double Lumen 02/27/18 PICC Right Cephalic 38 cm 1 cm (Active)  Indication for Insertion or Continuance of Line Limited venous access - need for IV therapy >5 days (PICC only) 02/27/2018 11:00 AM  Exposed Catheter (cm) 1 cm 02/27/2018 11:00 AM  Site Assessment Clean;Dry;Intact 02/27/2018 11:00 AM  Lumen #1 Status Flushed;Blood return noted;Saline locked 02/27/2018 11:00 AM  Lumen #2 Status Flushed;Blood return noted;Saline locked 02/27/2018 11:00 AM  Dressing Type Transparent 02/27/2018 11:00 AM  Dressing Status Clean;Dry;Intact;Antimicrobial disc in place 02/27/2018 11:00 AM  Line Care Connections checked and tightened 02/27/2018 11:00 AM  Dressing Intervention New dressing;Antimicrobial disc changed 02/27/2018 11:00 AM  Dressing Change Due 03/06/18 02/27/2018 11:00 AM       Lorenza Cambridge 02/27/2018, 12:01 PM

## 2018-02-27 NOTE — Progress Notes (Signed)
Subjective: Patient reports intubated, sedated  Objective: Vital signs in last 24 hours: Temp:  [97.9 F (36.6 C)-99.9 F (37.7 C)] 99.5 F (37.5 C) (01/05 0700) Pulse Rate:  [66-124] 68 (01/05 0700) Resp:  [16-23] 18 (01/05 0700) BP: (99-233)/(39-129) 137/53 (01/05 0700) SpO2:  [89 %-100 %] 100 % (01/05 0700) FiO2 (%):  [40 %-50 %] 40 % (01/05 0352) Weight:  [91.2 kg] 91.2 kg (01/04 2300)  Intake/Output from previous day: 01/04 0701 - 01/05 0700 In: 1708.8 [I.V.:1106.3; IV Piggyback:602.5] Out: 1677 [Urine:1427; Emesis/NG output:250] Intake/Output this shift: No intake/output data recorded.  Physical Exam: Intubated, sedated.  Pupils reactive and equal.  Minimal movement left side, more purposeful right upper extremity.  Lab Results: Recent Labs    02/23/2018 1716  WBC 25.5*  HGB 16.5*  17.7*  HCT 50.9*  52.0*  PLT 305   BMET Recent Labs    03/07/2018 1716  02/27/18 0000 02/27/18 0302  NA 134*  135   < > 135 138  K 3.4*  3.3*  --  4.1  --   CL 98  99  --  99  --   CO2 20*  --  20*  --   GLUCOSE 267*  244*  --  133*  --   BUN 12  13  --  13  --   CREATININE 1.18*  0.90  --  1.14*  --   CALCIUM 9.3  --  8.8*  --    < > = values in this interval not displayed.    Studies/Results: Ct Head Wo Contrast  Result Date: 02/27/2018 CLINICAL DATA:  Follow-up examination for intracranial hemorrhage. EXAM: CT HEAD WITHOUT CONTRAST TECHNIQUE: Contiguous axial images were obtained from the base of the skull through the vertex without intravenous contrast. COMPARISON:  Prior CT from 03/04/2018. FINDINGS: Brain: Large intraparenchymal hemorrhage centered at the right basal ganglia again seen, measuring 8.6 x 3.8 x 4.4 cm (estimated volume 71.9 cc, previously approximately 75 cc). Surrounding low-density vasogenic edema with regional mass effect with up to 10 mm of right-to-left midline shift, relatively similar to previous. Partial basilar cistern effacement similar to  previous. Intraventricular extension with blood primarily in the lateral and third ventricles, also similar to previous. Stable ventricular size and morphology without worsening hydrocephalus or ventricular trapping. Remainder of the brain is stable in appearance. No new intracranial hemorrhage. No other acute large vessel territory infarct. No extra-axial collection. Vascular: No asymmetric hyperdense vessel. Skull: Small evolving left parietotemporal scalp contusion calvarium intact. Sinuses/Orbits: Globes and orbital soft tissues demonstrate no acute finding. Scattered mucosal thickening with air-fluid levels noted within the paranasal sinuses. Patient likely intubated. Mastoid air cells remain clear. Other: None. IMPRESSION: 1. Similar to slightly decreased size of large intraparenchymal hematoma centered at the right basal ganglia, estimated volume 71.9 cc on current exam, previously 75 cc. 2. Similar regional mass effect with up to 10 mm of right-to-left midline shift. 3. Associated intraventricular extension with blood primarily in the lateral and third ventricles, similar to previous. 4. No other new intracranial abnormality. Electronically Signed   By: Jeannine Boga M.D.   On: 02/27/2018 02:59   Ct Head Wo Contrast  Result Date: 03/23/2018 CLINICAL DATA:  53 y/o F; altered level of consciousness. Found unresponsive in a bathtub. EXAM: CT HEAD WITHOUT CONTRAST CT CERVICAL SPINE WITHOUT CONTRAST TECHNIQUE: Multidetector CT imaging of the head and cervical spine was performed following the standard protocol without intravenous contrast. Multiplanar CT image reconstructions of the cervical  spine were also generated. COMPARISON:  01/09/2016 cervical spine radiographs FINDINGS: CT HEAD FINDINGS Brain: Acute hemorrhage centered in the right basal ganglia with extension into the right cerebral hemisphere measuring 8.4 x 3.9 x 4.6 cm (volume = 79 cm^3) (AP x ML x CC series 4, image 24 and series 6, image  38). There is extension of the hematoma into the intraventricular system, predominantly the right lateral ventricle. Associated edema and mass effect results in 10 mm of right-to-left midline shift, right-sided uncal herniation, and partial effacement of the basilar cisterns. No transtentorial or foramen magnum herniation. Vascular: No hyperdense vessel or unexpected calcification. Skull: Normal. Negative for fracture or focal lesion. Sinuses/Orbits: Small paranasal sinus fluid levels. Normal aeration of mastoid air cells. Orbits are unremarkable. Other: None. CT CERVICAL SPINE FINDINGS Alignment: Normal. Skull base and vertebrae: No acute fracture. No primary bone lesion or focal pathologic process. Soft tissues and spinal canal: No prevertebral fluid or swelling. No visible canal hematoma. Disc levels: Mild spondylosis of the cervical spine with predominantly discogenic degenerative changes greatest at the C5-C7 levels. Upper chest: Negative. Other: 15 mm nodule within the left lobe of the thyroid gland. IMPRESSION: CT head: 1. Acute hemorrhage centered in right basal ganglia with extension into right cerebral hemisphere measuring up to 8.4 cm, 79 CC. 2. Extension of hemorrhage into the intraventricular system, greatest in the right lateral ventricle. 3. Mass effect with 10 mm right-to-left midline shift, right-sided uncal herniation, and partial effacement of basilar cisterns. No downward herniation at this time. CT cervical spine. 1. No acute fracture or dislocation of the cervical spine. 2. 15 mm nodule within left lobe of thyroid gland. Further evaluation with thyroid ultrasound is recommended on a nonemergent basis. 3. Mild spondylosis of the cervical spine. Critical Value/emergent results were called by telephone at the time of interpretation on 03/08/2018 at 5:55 pm to Dr. Nanda Quinton , who verbally acknowledged these results. Electronically Signed   By: Kristine Garbe M.D.   On: 03/01/2018 18:00    Ct Cervical Spine Wo Contrast  Result Date: 02/28/2018 CLINICAL DATA:  53 y/o F; altered level of consciousness. Found unresponsive in a bathtub. EXAM: CT HEAD WITHOUT CONTRAST CT CERVICAL SPINE WITHOUT CONTRAST TECHNIQUE: Multidetector CT imaging of the head and cervical spine was performed following the standard protocol without intravenous contrast. Multiplanar CT image reconstructions of the cervical spine were also generated. COMPARISON:  01/09/2016 cervical spine radiographs FINDINGS: CT HEAD FINDINGS Brain: Acute hemorrhage centered in the right basal ganglia with extension into the right cerebral hemisphere measuring 8.4 x 3.9 x 4.6 cm (volume = 79 cm^3) (AP x ML x CC series 4, image 24 and series 6, image 38). There is extension of the hematoma into the intraventricular system, predominantly the right lateral ventricle. Associated edema and mass effect results in 10 mm of right-to-left midline shift, right-sided uncal herniation, and partial effacement of the basilar cisterns. No transtentorial or foramen magnum herniation. Vascular: No hyperdense vessel or unexpected calcification. Skull: Normal. Negative for fracture or focal lesion. Sinuses/Orbits: Small paranasal sinus fluid levels. Normal aeration of mastoid air cells. Orbits are unremarkable. Other: None. CT CERVICAL SPINE FINDINGS Alignment: Normal. Skull base and vertebrae: No acute fracture. No primary bone lesion or focal pathologic process. Soft tissues and spinal canal: No prevertebral fluid or swelling. No visible canal hematoma. Disc levels: Mild spondylosis of the cervical spine with predominantly discogenic degenerative changes greatest at the C5-C7 levels. Upper chest: Negative. Other: 15 mm nodule within the  left lobe of the thyroid gland. IMPRESSION: CT head: 1. Acute hemorrhage centered in right basal ganglia with extension into right cerebral hemisphere measuring up to 8.4 cm, 79 CC. 2. Extension of hemorrhage into the  intraventricular system, greatest in the right lateral ventricle. 3. Mass effect with 10 mm right-to-left midline shift, right-sided uncal herniation, and partial effacement of basilar cisterns. No downward herniation at this time. CT cervical spine. 1. No acute fracture or dislocation of the cervical spine. 2. 15 mm nodule within left lobe of thyroid gland. Further evaluation with thyroid ultrasound is recommended on a nonemergent basis. 3. Mild spondylosis of the cervical spine. Critical Value/emergent results were called by telephone at the time of interpretation on 03/16/2018 at 5:55 pm to Dr. Nanda Quinton , who verbally acknowledged these results. Electronically Signed   By: Kristine Garbe M.D.   On: 03/05/2018 18:00   Dg Chest Portable 1 View  Result Date: 03/11/2018 CLINICAL DATA:  Endotracheal tube adjustment. EXAM: PORTABLE CHEST 1 VIEW COMPARISON:  03/08/2018 at 1805 hours FINDINGS: Since the earlier exam, the endotracheal tube has been retracted. Tip now projects 1 cm above the [place]. Nasal/orogastric tube is well positioned passing below the diaphragm into the mid stomach. There is significant improvement in lung aeration on the left following endotracheal tube adjustment. There is persistent opacity at the left lung base consistent with pneumonia or atelectasis. Right lung remains clear. IMPRESSION: 1. Endotracheal tube is now well-positioned with subsequent significant improvement in left lung aeration. Electronically Signed   By: Lajean Manes M.D.   On: 03/03/2018 19:14   Dg Chest Portable 1 View  Addendum Date: 03/22/2018   ADDENDUM REPORT: 03/20/2018 18:43 ADDENDUM: The original report was by Dr. Van Clines. The following addendum is by Dr. Van Clines: Critical Value/emergent results were called by telephone at the time of interpretation on 02/25/2018 at 6:43 pm to Dr. Nanda Quinton , who verbally acknowledged these results. Electronically Signed   By: Van Clines  M.D.   On: 03/17/2018 18:43   Result Date: 03/19/2018 CLINICAL DATA:  Orogastric tube placement EXAM: PORTABLE CHEST 1 VIEW COMPARISON:  03/11/2018 at 5:09 p.m. FINDINGS: The endotracheal tube is just into the right mainstem bronchus. There is interval atelectasis of most of the left lung. Retraction of 5 cm is recommended. An orogastric tube enters the stomach. The right lung appears clear.  Left heart border obscured. IMPRESSION: 1. Right mainstem bronchus intubation, with interval atelectasis of much of the left lung. I recommend retracting the endotracheal tube 5 cm and obtaining a repeat chest radiograph. Radiology assistant personnel have been notified to put me in telephone contact with the referring physician or the referring physician's clinical representative in order to discuss these findings. Once this communication is established I will issue an addendum to this report for documentation purposes. Electronically Signed: By: Van Clines M.D. On: 03/13/2018 18:38   Dg Chest Portable 1 View  Result Date: 03/01/2018 CLINICAL DATA:  Per EMS- pt here from home, was found in a bathtub face up. She was unresponsive and required assisted ventilations with a BVM. Pt now on non rebreather. Hx of ETOH abuse and seizures. No hx of drug abuse. Hx of HTN. Former smoker EXAM: PORTABLE CHEST 1 VIEW COMPARISON:  None. FINDINGS: Cardiac silhouette is normal in size. No mediastinal or hilar masses. No evidence of adenopathy. Lungs are clear. No gross pleural effusion or pneumothorax on this supine study. Skeletal structures are grossly intact. IMPRESSION: No active disease.  Electronically Signed   By: Lajean Manes M.D.   On: 03/14/2018 17:20   Dg Abd Portable 1v  Result Date: 03/05/2018 CLINICAL DATA:  Orogastric tube placement. EXAM: PORTABLE ABDOMEN - 1 VIEW COMPARISON:  None. FINDINGS: Nasogastric tube tip projects in mid stomach. The included bowel gas pattern is normal. No radio-opaque calculi or other  significant radiographic abnormality are seen. Dense LEFT lung consolidation. IMPRESSION: Nasogastric tube tip projects in mid stomach. Electronically Signed   By: Elon Alas M.D.   On: 03/02/2018 18:36    Assessment/Plan: Head CT stable to improved.  Exam stable.  Continue medical management and support.  Prognosis guarded.    LOS: 1 day    Peggyann Shoals, MD 02/27/2018, 8:39 AM

## 2018-02-27 NOTE — Progress Notes (Signed)
OT Cancellation Note  Patient Details Name: Jacqueline Pratt MRN: [id number] DOB: 04-09-1965   Cancelled Treatment:    Reason Eval/Treat Not Completed: Active bedrest order and pt on vent. Will return with increase in activity orders and as schedule allows. Thank you.  [place], OTR/L Acute Rehab Pager: [phone] Office: [phone]  02/27/2018, 7:28 AM

## 2018-02-27 NOTE — Plan of Care (Signed)
Plan of care note  CK noted to be trending up, will start LR infusion at 150 ml/hr and continue to trend CK  Mali Karlos Scadden, [place] [facility] Pager [phone] After hours pager: [phone]

## 2018-02-28 ENCOUNTER — Inpatient Hospital Stay (HOSPITAL_COMMUNITY): Payer: Managed Care, Other (non HMO)

## 2018-02-28 LAB — GLUCOSE, CAPILLARY
GLUCOSE-CAPILLARY: 118 mg/dL — AB (ref 70–99)
Glucose-Capillary: 106 mg/dL — ABNORMAL HIGH (ref 70–99)
Glucose-Capillary: 108 mg/dL — ABNORMAL HIGH (ref 70–99)
Glucose-Capillary: 95 mg/dL (ref 70–99)
Glucose-Capillary: 129 mg/dL — ABNORMAL HIGH (ref 70–99)

## 2018-02-28 LAB — CBC
HCT: 34.4 % — ABNORMAL LOW (ref 36.0–46.0)
Hemoglobin: 10.7 g/dL — ABNORMAL LOW (ref 12.0–15.0)
MCH: 29.6 pg (ref 26.0–34.0)
MCHC: 31.1 g/dL (ref 30.0–36.0)
MCV: 95 fL (ref 80.0–100.0)
Platelets: 165 10*3/uL (ref 150–400)
RBC: 3.62 MIL/uL — ABNORMAL LOW (ref 3.87–5.11)
RDW: 13.2 % (ref 11.5–15.5)
WBC: 8.4 10*3/uL (ref 4.0–10.5)
nRBC: 0 % (ref 0.0–0.2)

## 2018-02-28 LAB — BASIC METABOLIC PANEL
Anion gap: 5 (ref 5–15)
BUN: 16 mg/dL (ref 6–20)
CO2: 21 mmol/L — ABNORMAL LOW (ref 22–32)
Calcium: 8 mg/dL — ABNORMAL LOW (ref 8.9–10.3)
Chloride: 125 mmol/L — ABNORMAL HIGH (ref 98–111)
Creatinine, Ser: 0.9 mg/dL (ref 0.44–1.00)
GFR calc Af Amer: >60 mL/min (ref >60)
GFR calc non Af Amer: >60 mL/min (ref >60)
Glucose, Bld: 128 mg/dL — ABNORMAL HIGH (ref 70–99)
Potassium: 3.7 mmol/L (ref 3.5–5.1)
Sodium: 151 mmol/L — ABNORMAL HIGH (ref 135–145)

## 2018-02-28 LAB — POCT I-STAT 3, ART BLOOD GAS (G3+)
Acid-base deficit: 2 mmol/L (ref 0.0–2.0)
Bicarbonate: 21.5 mmol/L (ref 20.0–28.0)
O2 Saturation: 95 %
PCO2 ART: 34 mmHg (ref 32.0–48.0)
Patient temperature: 99.3
TCO2: 22 mmol/L (ref 22–32)
pH, Arterial: 7.41 (ref 7.350–7.450)
pO2, Arterial: 78 mmHg — ABNORMAL LOW (ref 83.0–108.0)

## 2018-02-28 LAB — SODIUM
Sodium: 156 mmol/L — ABNORMAL HIGH (ref 135–145)
Sodium: 169 mmol/L (ref 135–145)

## 2018-02-28 LAB — PHOSPHORUS
Phosphorus: 1.2 mg/dL — ABNORMAL LOW (ref 2.5–4.6)
Phosphorus: 1.5 mg/dL — ABNORMAL LOW (ref 2.5–4.6)

## 2018-02-28 LAB — CK: Total CK: 1970 U/L — ABNORMAL HIGH (ref 38–234)

## 2018-02-28 LAB — MAGNESIUM
Magnesium: 2.1 mg/dL (ref 1.7–2.4)
Magnesium: 2 mg/dL (ref 1.7–2.4)

## 2018-02-28 MED ORDER — HYDRALAZINE HCL 20 MG/ML IJ SOLN
10.0000 mg | INTRAMUSCULAR | Status: DC | PRN
  Administered 2018-02-28 – 2018-03-06 (×8): 10 mg via INTRAVENOUS
  Filled 2018-02-28 (×9): qty 1

## 2018-02-28 MED ORDER — VITAL HIGH PROTEIN PO LIQD
1000.0000 mL | ORAL | Status: DC
  Administered 2018-02-28: 1000 mL
  Filled 2018-02-28 (×2): qty 1000

## 2018-02-28 MED ORDER — IOPAMIDOL (ISOVUE-370) INJECTION 76%
75.0000 mL | Freq: Once | INTRAVENOUS | Status: AC | PRN
  Administered 2018-02-28: 05:00:00 via INTRAVENOUS

## 2018-02-28 MED ORDER — IOPAMIDOL (ISOVUE-370) INJECTION 76%
INTRAVENOUS | Status: AC
  Filled 2018-02-28: qty 100

## 2018-02-28 MED ORDER — PRO-STAT SUGAR FREE PO LIQD
30.0000 mL | Freq: Two times a day (BID) | ORAL | Status: DC
  Administered 2018-02-28: 30 mL
  Filled 2018-02-28: qty 30

## 2018-02-28 MED ORDER — PRO-STAT SUGAR FREE PO LIQD
30.0000 mL | Freq: Every day | ORAL | Status: DC
  Administered 2018-03-01 – 2018-03-04 (×4): 30 mL
  Filled 2018-02-28 (×4): qty 30

## 2018-02-28 MED ORDER — ENOXAPARIN SODIUM 40 MG/0.4ML ~~LOC~~ SOLN
40.0000 mg | SUBCUTANEOUS | Status: DC
  Administered 2018-02-28 – 2018-03-07 (×8): 40 mg via SUBCUTANEOUS
  Filled 2018-02-28 (×8): qty 0.4

## 2018-02-28 MED ORDER — ADULT MULTIVITAMIN W/MINERALS CH
1.0000 | ORAL_TABLET | Freq: Every day | ORAL | Status: DC
  Administered 2018-02-28 – 2018-03-07 (×8): 1
  Filled 2018-02-28 (×8): qty 1

## 2018-02-28 MED ORDER — AMLODIPINE BESYLATE 5 MG PO TABS
5.0000 mg | ORAL_TABLET | Freq: Every day | ORAL | Status: DC
  Administered 2018-02-28 – 2018-03-01 (×2): 5 mg
  Filled 2018-02-28 (×2): qty 1

## 2018-02-28 MED ORDER — SENNOSIDES 8.8 MG/5ML PO SYRP
5.0000 mL | ORAL_SOLUTION | Freq: Two times a day (BID) | ORAL | Status: DC
  Administered 2018-02-28 – 2018-03-06 (×14): 5 mL
  Filled 2018-02-28 (×14): qty 5

## 2018-02-28 MED ORDER — AMLODIPINE BESYLATE 5 MG PO TABS: 5.0000 mg | ORAL_TABLET | Freq: Every day | ORAL | Status: DC

## 2018-02-28 MED ORDER — TRAZODONE HCL 50 MG PO TABS: 50.0000 mg | ORAL_TABLET | Freq: Every day | ORAL | Status: DC

## 2018-02-28 MED ORDER — VITAL HIGH PROTEIN PO LIQD
1000.0000 mL | ORAL | Status: DC
  Administered 2018-03-02 – 2018-03-03 (×2): 1000 mL

## 2018-02-28 MED ORDER — FOLIC ACID 1 MG PO TABS
1.0000 mg | ORAL_TABLET | Freq: Every day | ORAL | Status: DC
  Administered 2018-02-28 – 2018-03-07 (×8): 1 mg
  Filled 2018-02-28 (×8): qty 1

## 2018-02-28 NOTE — Progress Notes (Signed)
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 On EMS arrival, she was unresponsive, incontinent of stool and had labored respirations, requiring assisted ventilations with a BVM.Marland Kitchen   SUBJECTIVE :    her sister, and niece are at the bedside. She remains on sedation.  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 there is no family h/o sah or aneurysms   Past Medical History:  Diagnosis Date  . Anxiety   . Depression   . Hypertension   . Migraines   . Seizures ([place])    last seizure 3 years ago per pt    Past Surgical History:  Procedure Laterality Date  . EXAM UNDER ANESTHESIA WITH MANIPULATION OF SHOULDER    . INCISION AND DRAINAGE     left lower side- staph infection  . STAPEDES SURGERY      Family History  Problem Relation Age of Onset  . Diabetes Mother   . Non-Hodgkin's lymphoma Sister   . Rectal cancer Maternal Grandfather   . Hypertension Maternal Grandfather   . Diabetes Maternal Grandfather   . Colon cancer Neg Hx    Social History:  reports that she has quit smoking. She has never used smokeless tobacco. She reports current alcohol use of about 14.0 standard drinks of alcohol per week. She reports that she does not use drugs.  Allergies:  Allergies  Allergen Reactions  . Aspirin Other (See Comments) and Nausea And Vomiting  . Lovastatin Diarrhea  . Simvastatin Diarrhea  . Statins Other (See Comments)    Diarrhea   . Tetracyclines & Related Nausea  Only    Home Medications   ROS: Unable to obtain due to sedation  Physical Examination: Blood pressure (!) 148/56, pulse 95, temperature 99.5 F (37.5 C), resp. rate 20, height 5\' 2"  (1.575 m), weight 91.2 kg, SpO2 97 %.  HEENT-  [place]/AT  Lungs - Intubated on ventilator Extremities - Warm and well perfused  Neurologic Examination: Mental Status: Intubated and sedated on propofol. No eye opening spontaneously or to command. Flails RUE and RLE to sternal rub. Occasional extensor posturing. No response to voice.  Cranial Nerves: II:  Pupils 52mm bilaterally and unreactive (sedated)- left slugglish. No blink to threat.  Fundi not visualized III,IV, VI: No doll's eye reflex (sedated). Positive corneal reflexes  VII: Face flaccidly symmetric.  VIII: No response to voice IX,X: Intubated XI: Unable to assess XII: positive gag with introduction of inline cath. Mildly delayed Motor/Sensory: RUE: Flails nonpurposefully to noxious stimuli.  RLE: Withdraws to noxious and flails nonpurposefully intermittently LUE: Increased extensor tone.no movement with noxious stimuli LLE: Increased extensor tone. Intermittent extension, adduction and internal rotation.  Muscle bulk is normal x 4  Cerebellar/Gait: Unable to assess   Results for orders placed or performed during the hospital encounter of 03/17/2018 (from the past 48 hour(s))  I-stat troponin, ED     Status: Abnormal   Collection Time: 03/01/2018  5:14 PM  Result Value Ref Range   Troponin i, poc 0.30 (HH) 0.00 -  0.08 ng/mL   Comment NOTIFIED PHYSICIAN    Comment 3            Comment: Due to the release kinetics of cTnI, a negative result within the first hours of the onset of symptoms does not rule out myocardial infarction with certainty. If myocardial infarction is still suspected, repeat the test at appropriate intervals.   I-Stat beta hCG blood, ED     Status: None   Collection Time: 03/16/2018  5:15 PM  Result Value Ref Range    I-stat hCG, quantitative <5.0 <5 mIU/mL   Comment 3            Comment:   GEST. AGE      CONC.  (mIU/mL)   <=1 WEEK        5 - 50     2 WEEKS       50 - 500     3 WEEKS       100 - 10,000     4 WEEKS     1,000 - 30,000        FEMALE AND NON-PREGNANT FEMALE:     LESS THAN 5 mIU/mL   I-Stat Chem 8, ED     Status: Abnormal   Collection Time: 03/25/2018  5:16 PM  Result Value Ref Range   Sodium 135 135 - 145 mmol/L   Potassium 3.3 (L) 3.5 - 5.1 mmol/L   Chloride 99 98 - 111 mmol/L   BUN 13 6 - 20 mg/dL   Creatinine, Ser 0.90 0.44 - 1.00 mg/dL   Glucose, Bld 244 (H) 70 - 99 mg/dL   Calcium, Ion 1.19 1.15 - 1.40 mmol/L   TCO2 24 22 - 32 mmol/L   Hemoglobin 17.7 (H) 12.0 - 15.0 g/dL   HCT 52.0 (H) 36.0 - 46.0 %  Ethanol     Status: None   Collection Time: 03/22/2018  5:16 PM  Result Value Ref Range   Alcohol, Ethyl (B) <10 <10 mg/dL    Comment: (NOTE) Lowest detectable limit for serum alcohol is 10 mg/dL. For medical purposes only. Performed at [facility], [place] [address] [place], [place] [address]   Protime-INR     Status: None   Collection Time: 03/24/2018  5:16 PM  Result Value Ref Range   Prothrombin Time 13.0 11.4 - 15.2 seconds   INR 0.99     Comment: Performed at [facility] [address], [place], [place] [address]  APTT     Status: None   Collection Time: 03/15/2018  5:16 PM  Result Value Ref Range   aPTT 24 24 - 36 seconds    Comment: Performed at [facility] [address] [place], [place] [address]  CBC     Status: Abnormal   Collection Time: 03/08/2018  5:16 PM  Result Value Ref Range   WBC 25.5 (H) 4.0 - 10.5 K/uL   RBC 5.58 (H) 3.87 - 5.11 MIL/uL   Hemoglobin 16.5 (H) 12.0 - 15.0 g/dL   HCT 50.9 (H) 36.0 - 46.0 %   MCV 91.2 80.0 - 100.0 fL   MCH 29.6 26.0 - 34.0 pg   MCHC 32.4 30.0 - 36.0 g/dL   RDW 12.2 11.5 - 15.5 %   Platelets 305 150 - 400 K/uL   nRBC 0.0 0.0 - 0.2 %    Comment: Performed at [facility], [place] [address]  [place], [place] [address]  Differential  Status: Abnormal   Collection Time: 03/15/2018  5:16 PM  Result Value Ref Range   Neutrophils Relative % 83 %   Neutro Abs 21.5 (H) 1.7 - 7.7 K/uL   Lymphocytes Relative 11 %   Lymphs Abs 2.7 0.7 - 4.0 K/uL   Monocytes Relative 4 %   Monocytes Absolute 1.0 0.1 - 1.0 K/uL   Eosinophils Relative 0 %   Eosinophils Absolute 0.0 0.0 - 0.5 K/uL   Basophils Relative 1 %   Basophils Absolute 0.1 0.0 - 0.1 K/uL   RBC Morphology MORPHOLOGY UNREMARKABLE    Immature Granulocytes 1 %   Abs Immature Granulocytes 0.24 (H) 0.00 - 0.07 K/uL    Comment: Performed at [facility] [address], [place], [place] [address]  Comprehensive metabolic panel     Status: Abnormal   Collection Time: 03/05/2018  5:16 PM  Result Value Ref Range   Sodium 134 (L) 135 - 145 mmol/L   Potassium 3.4 (L) 3.5 - 5.1 mmol/L   Chloride 98 98 - 111 mmol/L   CO2 20 (L) 22 - 32 mmol/L   Glucose, Bld 267 (H) 70 - 99 mg/dL   BUN 12 6 - 20 mg/dL   Creatinine, Ser 1.18 (H) 0.44 - 1.00 mg/dL   Calcium 9.3 8.9 - 10.3 mg/dL   Total Protein 8.2 (H) 6.5 - 8.1 g/dL   Albumin 4.6 3.5 - 5.0 g/dL   AST 81 (H) 15 - 41 U/L   ALT 50 (H) 0 - 44 U/L   Alkaline Phosphatase 90 38 - 126 U/L   Total Bilirubin 0.7 0.3 - 1.2 mg/dL   GFR calc non Af Amer 53 (L) >60 mL/min   GFR calc Af Amer >60 >60 mL/min   Anion gap 16 (H) 5 - 15    Comment: Performed at [facility], [place] [address] [place], [place] [address]  CK     Status: Abnormal   Collection Time: 03/11/2018  5:16 PM  Result Value Ref Range   Total CK 2,884 (H) 38 - 234 U/L    Comment: Performed at [facility], [place] [address] [place], [place] [address]  I-Stat venous blood gas, ED     Status: Abnormal   Collection Time: 02/25/2018  5:16 PM  Result Value Ref Range   pH, Ven 7.209 (L) 7.250 - 7.430   pCO2, Ven 62.0 (H) 44.0 - 60.0 mmHg   pO2, Ven 42.0 32.0 - 45.0 mmHg   Bicarbonate 24.8 20.0 - 28.0 mmol/L   TCO2 27 22 - 32  mmol/L   O2 Saturation 65.0 %   Acid-base deficit 5.0 (H) 0.0 - 2.0 mmol/L   Patient temperature HIDE    Sample type VENOUS   Lactic acid, plasma     Status: Abnormal   Collection Time: 03/16/2018  9:20 PM  Result Value Ref Range   Lactic Acid, Venous 4.2 (HH) 0.5 - 1.9 mmol/L    Comment: CRITICAL RESULT CALLED TO, READ BACK BY AND VERIFIED WITH: DICKSON G,RN 03/02/2018 2202 WAYK Performed at [facility], [place] [address], [place], [place] [address]   Urine rapid drug screen (hosp performed)     Status: None   Collection Time: 03/18/2018  9:29 PM  Result Value Ref Range   Opiates NONE DETECTED NONE DETECTED   Cocaine NONE DETECTED NONE DETECTED   Benzodiazepines NONE DETECTED NONE DETECTED   Amphetamines NONE DETECTED NONE DETECTED   Tetrahydrocannabinol NONE DETECTED NONE  DETECTED   Barbiturates NONE DETECTED NONE DETECTED    Comment: (NOTE) DRUG SCREEN FOR MEDICAL PURPOSES ONLY.  IF CONFIRMATION IS NEEDED FOR ANY PURPOSE, NOTIFY LAB WITHIN 5 DAYS. LOWEST DETECTABLE LIMITS FOR URINE DRUG SCREEN Drug Class                     Cutoff (ng/mL) Amphetamine and metabolites    1000 Barbiturate and metabolites    200 Benzodiazepine                 357 Tricyclics and metabolites     300 Opiates and metabolites        300 Cocaine and metabolites        300 THC                            50 Performed at [facility], [place] [address] [place], [place] [address]   Urinalysis, Routine w reflex microscopic     Status: Abnormal   Collection Time: 02/25/2018  9:29 PM  Result Value Ref Range   Color, Urine YELLOW YELLOW   APPearance CLEAR CLEAR   Specific Gravity, Urine 1.012 1.005 - 1.030   pH 7.0 5.0 - 8.0   Glucose, UA 150 (A) NEGATIVE mg/dL   Hgb urine dipstick LARGE (A) NEGATIVE   Bilirubin Urine NEGATIVE NEGATIVE   Ketones, ur 5 (A) NEGATIVE mg/dL   Protein, ur 100 (A) NEGATIVE mg/dL   Nitrite NEGATIVE NEGATIVE   Leukocytes, UA NEGATIVE NEGATIVE   RBC / HPF 6-10 0 - 5  RBC/hpf   WBC, UA 0-5 0 - 5 WBC/hpf   Bacteria, UA NONE SEEN NONE SEEN   Squamous Epithelial / LPF 0-5 0 - 5   Mucus PRESENT     Comment: Performed at [facility], [address], [place], [place] [address]  MRSA PCR Screening     Status: None   Collection Time: 03/17/2018 10:08 PM  Result Value Ref Range   MRSA by PCR NEGATIVE NEGATIVE    Comment:        The GeneXpert MRSA Assay (FDA approved for NASAL specimens only), is one component of a comprehensive MRSA colonization surveillance program. It is not intended to diagnose MRSA infection nor to guide or monitor treatment for MRSA infections. Performed at [facility], [place] [address], [place], [place] [address]   HIV antibody (Routine Testing)     Status: None   Collection Time: 03/15/2018 10:26 PM  Result Value Ref Range   HIV Screen 4th Generation wRfx Non Reactive Non Reactive    Comment: (NOTE) Performed At: [facility] [place], [place] [id number] Rush Farmer MD [id number]   Sodium     Status: None   Collection Time: 03/22/2018 10:26 PM  Result Value Ref Range   Sodium 135 135 - 145 mmol/L    Comment: Performed at [facility], [place] [address] [place], [place] [address]  Glucose, capillary     Status: Abnormal   Collection Time: 03/12/2018 11:43 PM  Result Value Ref Range   Glucose-Capillary 132 (H) 70 - 99 mg/dL  Basic metabolic panel     Status: Abnormal   Collection Time: 02/27/18 12:00 AM  Result Value Ref Range   Sodium 135 135 - 145 mmol/L   Potassium 4.1 3.5 - 5.1 mmol/L   Chloride 99 98 - 111 mmol/L   CO2 20 (  L) 22 - 32 mmol/L   Glucose, Bld 133 (H) 70 - 99 mg/dL   BUN 13 6 - 20 mg/dL   Creatinine, Ser 1.14 (H) 0.44 - 1.00 mg/dL   Calcium 8.8 (L) 8.9 - 10.3 mg/dL   GFR calc non Af Amer 55 (L) >60 mL/min   GFR calc Af Amer >60 >60 mL/min   Anion gap 16 (H) 5 - 15    Comment: Performed at [facility] [address] [place], [place] [address]  Lactic  acid, plasma     Status: Abnormal   Collection Time: 02/27/18 12:00 AM  Result Value Ref Range   Lactic Acid, Venous 3.9 (HH) 0.5 - 1.9 mmol/L    Comment: CRITICAL RESULT CALLED TO, READ BACK BY AND VERIFIED WITH: DICKSON G,RN 02/27/18 0115 WAYK Performed at [facility], [place] [address], [place], [place] [address]   Triglycerides     Status: Abnormal   Collection Time: 02/27/18 12:00 AM  Result Value Ref Range   Triglycerides 170 (H) <150 mg/dL    Comment: Performed at [facility] [address], [place], [place] [address]  Troponin I - Now Then Q6H     Status: Abnormal   Collection Time: 02/27/18 12:00 AM  Result Value Ref Range   Troponin I 1.11 (HH) <0.03 ng/mL    Comment: CRITICAL RESULT CALLED TO, READ BACK BY AND VERIFIED WITH: [facility] N,RN 02/27/18 0125 WAYK Performed at [facility], [place] [address] [place], [place] [address]   CK     Status: Abnormal   Collection Time: 02/27/18 12:00 AM  Result Value Ref Range   Total CK 8,004 (H) 38 - 234 U/L    Comment: RESULTS CONFIRMED BY MANUAL DILUTION Performed at [facility], [place] [address], [place], [place] [address]   Troponin I - Now Then Q6H     Status: Abnormal   Collection Time: 02/27/18  3:01 AM  Result Value Ref Range   Troponin I 0.83 (HH) <0.03 ng/mL    Comment: CRITICAL VALUE NOTED.  VALUE IS CONSISTENT WITH PREVIOUSLY REPORTED AND CALLED VALUE. Performed at [facility], [place] [address] [place],  [place] [address]   Sodium     Status: None   Collection Time: 02/27/18  3:02 AM  Result Value Ref Range   Sodium 138 135 - 145 mmol/L    Comment: Performed at [facility], [place] [address] [place], [place] [address]  Beta-hydroxybutyric acid     Status: None   Collection Time: 02/27/18  3:02 AM  Result Value Ref Range   Beta-Hydroxybutyric Acid 0.09 0.05 - 0.27 mmol/L    Comment: Performed at [facility] [address], [place], [place] [address]  Glucose, capillary      Status: Abnormal   Collection Time: 02/27/18  3:56 AM  Result Value Ref Range   Glucose-Capillary 104 (H) 70 - 99 mg/dL  CK     Status: Abnormal   Collection Time: 02/27/18  5:58 AM  Result Value Ref Range   Total CK 5,917 (H) 38 - 234 U/L    Comment: RESULTS CONFIRMED BY MANUAL DILUTION Performed at [facility], [place] [address], [place], [place] [address]   Glucose, capillary     Status: Abnormal   Collection Time: 02/27/18  8:41 AM  Result Value Ref Range   Glucose-Capillary 129 (H) 70 - 99 mg/dL   Comment 1 Notify RN  Comment 2 Document in Chart   Sodium     Status: None   Collection Time: 02/27/18  9:27 AM  Result Value Ref Range   Sodium 139 135 - 145 mmol/L    Comment: Performed at [facility], [place] [address] [place], [place] [address]  Triglycerides     Status: Abnormal   Collection Time: 02/27/18  9:27 AM  Result Value Ref Range   Triglycerides 217 (H) <150 mg/dL    Comment: Performed at [facility] [address] [place], [place] [address]  CK     Status: Abnormal   Collection Time: 02/27/18 10:59 AM  Result Value Ref Range   Total CK 4,225 (H) 38 - 234 U/L    Comment: RESULTS CONFIRMED BY MANUAL DILUTION Performed at [facility], [place] [address], [place], [place] [address]   Glucose, capillary     Status: Abnormal   Collection Time: 02/27/18 12:14 PM  Result Value Ref Range   Glucose-Capillary 103 (H) 70 - 99 mg/dL   Comment 1 Notify RN    Comment 2 Document in Chart   Glucose, capillary     Status: Abnormal   Collection Time: 02/27/18  4:10 PM  Result Value Ref Range   Glucose-Capillary 137 (H) 70 - 99 mg/dL   Comment 1 Notify RN    Comment 2 Document in Chart   Sodium     Status: None   Collection Time: 02/27/18  4:35 PM  Result Value Ref Range   Sodium 143 135 - 145 mmol/L    Comment: Performed at [facility], [place] [address] [place], [place] [address]  Glucose, capillary     Status: Abnormal   Collection Time:  02/27/18  7:45 PM  Result Value Ref Range   Glucose-Capillary 114 (H) 70 - 99 mg/dL  Basic metabolic panel     Status: Abnormal   Collection Time: 02/27/18  9:46 PM  Result Value Ref Range   Sodium 148 (H) 135 - 145 mmol/L   Potassium 3.4 (L) 3.5 - 5.1 mmol/L   Chloride 120 (H) 98 - 111 mmol/L   CO2 22 22 - 32 mmol/L   Glucose, Bld 106 (H) 70 - 99 mg/dL   BUN 14 6 - 20 mg/dL   Creatinine, Ser 0.88 0.44 - 1.00 mg/dL   Calcium 8.2 (L) 8.9 - 10.3 mg/dL   GFR calc non Af Amer >60 >60 mL/min   GFR calc Af Amer >60 >60 mL/min   Anion gap 6 5 - 15    Comment: Performed at [facility] [address] [place], [place] [address]  CK total and CKMB (cardiac)not at [facility]     Status: Abnormal   Collection Time: 02/27/18  9:46 PM  Result Value Ref Range   Total CK 2,451 (H) 38 - 234 U/L   CK, MB 12.5 (H) 0.5 - 5.0 ng/mL   Relative Index 0.5 0.0 - 2.5    Comment: Performed at [facility] [address] [place], [place] [address]  Glucose, capillary     Status: Abnormal   Collection Time: 02/27/18 11:19 PM  Result Value Ref Range   Glucose-Capillary 105 (H) 70 - 99 mg/dL  Glucose, capillary     Status: Abnormal   Collection Time: 02/28/18  3:36 AM  Result Value Ref Range   Glucose-Capillary 106 (H) 70 - 99 mg/dL  Basic metabolic panel     Status: Abnormal  Collection Time: 02/28/18  3:40 AM  Result Value Ref Range   Sodium 151 (H) 135 - 145 mmol/L   Potassium 3.7 3.5 - 5.1 mmol/L   Chloride 125 (H) 98 - 111 mmol/L   CO2 21 (L) 22 - 32 mmol/L   Glucose, Bld 128 (H) 70 - 99 mg/dL   BUN 16 6 - 20 mg/dL   Creatinine, Ser 0.90 0.44 - 1.00 mg/dL   Calcium 8.0 (L) 8.9 - 10.3 mg/dL   GFR calc non Af Amer >60 >60 mL/min   GFR calc Af Amer >60 >60 mL/min   Anion gap 5 5 - 15    Comment: Performed at [facility], [place] [address] [place], [place] [address]  CK     Status: Abnormal   Collection Time: 02/28/18  3:40 AM  Result Value Ref Range   Total CK 1,970 (H) 38 - 234  U/L    Comment: Performed at [facility], [place] [address], [place], [place] [address]  CBC     Status: Abnormal   Collection Time: 02/28/18  5:30 AM  Result Value Ref Range   WBC 8.4 4.0 - 10.5 K/uL    Comment: REPEATED TO VERIFY   RBC 3.62 (L) 3.87 - 5.11 MIL/uL   Hemoglobin 10.7 (L) 12.0 - 15.0 g/dL    Comment: REPEATED TO VERIFY RESULTS VERIFIED BY RECOLLECT    HCT 34.4 (L) 36.0 - 46.0 %   MCV 95.0 80.0 - 100.0 fL    Comment: REPEATED TO VERIFY   MCH 29.6 26.0 - 34.0 pg   MCHC 31.1 30.0 - 36.0 g/dL   RDW 13.2 11.5 - 15.5 %   Platelets 165 150 - 400 K/uL    Comment: REPEATED TO VERIFY   nRBC 0.0 0.0 - 0.2 %    Comment: Performed at [facility], [place] [address] [place], [place] [address]  Glucose, capillary     Status: Abnormal   Collection Time: 02/28/18  7:54 AM  Result Value Ref Range   Glucose-Capillary 108 (H) 70 - 99 mg/dL   Comment 1 Notify RN    Comment 2 Document in Chart   Sodium     Status: Abnormal   Collection Time: 02/28/18  8:01 AM  Result Value Ref Range   Sodium 156 (H) 135 - 145 mmol/L    Comment: Performed at [facility], [place] [address] [place], [place] [address]  Magnesium     Status: None   Collection Time: 02/28/18 10:25 AM  Result Value Ref Range   Magnesium 2.1 1.7 - 2.4 mg/dL    Comment: Performed at [facility], [place] [address], [place], [place] [address]  Phosphorus     Status: Abnormal   Collection Time: 02/28/18 10:25 AM  Result Value Ref Range   Phosphorus 1.2 (L) 2.5 - 4.6 mg/dL    Comment: Performed at [facility] [address], [place], [place] [address]  Glucose, capillary     Status: None   Collection Time: 02/28/18 11:42 AM  Result Value Ref Range   Glucose-Capillary 95 70 - 99 mg/dL   Comment 1 Notify RN    Comment 2 Document in Chart   I-STAT 3, arterial blood gas (G3+)     Status: Abnormal   Collection Time: 02/28/18  1:48 PM  Result Value Ref Range   pH, Arterial 7.410 7.350 - 7.450    pCO2 arterial 34.0 32.0 - 48.0  mmHg   pO2, Arterial 78.0 (L) 83.0 - 108.0 mmHg   Bicarbonate 21.5 20.0 - 28.0 mmol/L   TCO2 22 22 - 32 mmol/L   O2 Saturation 95.0 %   Acid-base deficit 2.0 0.0 - 2.0 mmol/L   Patient temperature 99.3 F    Collection site RADIAL, ALLEN'S TEST ACCEPTABLE    Drawn by RT    Sample type ARTERIAL   Glucose, capillary     Status: Abnormal   Collection Time: 02/28/18  4:02 PM  Result Value Ref Range   Glucose-Capillary 129 (H) 70 - 99 mg/dL   Comment 1 Notify RN    Comment 2 Document in Chart    Ct Angio Head W Or Wo Contrast  Result Date: 02/28/2018 CLINICAL DATA:  Follow-up examination for intracranial hemorrhage. EXAM: CT ANGIOGRAPHY HEAD TECHNIQUE: Multidetector CT imaging of the head was performed using the standard protocol during bolus administration of intravenous contrast. Multiplanar CT image reconstructions and MIPs were obtained to evaluate the vascular anatomy. CONTRAST:  <See Chart> ISOVUE-370 IOPAMIDOL (ISOVUE-370) INJECTION 76% COMPARISON:  Prior CT from 02/27/2018. FINDINGS: CT HEAD Brain: Large intraparenchymal hemorrhage centered at the right basal ganglia relatively unchanged in size, measuring 8.4 x 4.1 x 4.4 cm, stable when measured at similar levels on previous exam. Surrounding low-density vasogenic edema has increased and is now more hypodense and well-defined as compared to previous exam. Associated regional mass effect with up to 10 mm of right-to-left midline shift, stable. Right lateral ventricle is partially effaced. Intraventricular extension with blood in the lateral and third ventricles, relatively similar. Relatively stable ventricular size and morphology without worsening hydrocephalus or ventricular entrapment. Partial basilar cistern effacement similar. No transtentorial herniation. No other new intracranial hemorrhage. No acute large vessel territory infarct. No extra-axial fluid collection. Vascular: No hyperdense vessel. Skull:  Resolving left scalp contusion.  Calvarium unchanged. Sinuses: Moderate mucosal thickening throughout the paranasal sinuses with associated air-fluid levels. Fluid within the nasopharynx. Patient is intubated. Small bilateral mastoid effusions noted. Orbits: Globes and orbital soft tissues demonstrate no acute finding. CTA HEAD Anterior circulation: Visualized distal cervical segments of the internal carotid arteries are tortuous and mildly irregular but patent bilaterally. Petrous segments patent bilaterally. Scattered atheromatous irregularity throughout the cavernous/supraclinoid ICAs with resultant mild to moderate multifocal narrowing (no more than 50%). 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 There is a second aneurysm arising from the distal cavernous segment measuring 5 x 4 x 4 mm (series 13, image 109). This is directed medially and slightly posteriorly. A third aneurysm seen just distally arising from the supraclinoid left ICA measures 3 mm, and is directed anteriorly and slightly laterally (series 13, image 105). On the right, 4 mm focal outpouching arising from the proximal cavernous right ICA suspicious for a small aneurysm. This is directed laterally (series 13, image 118). There is a second aneurysm arising from the supraclinoid right ICA distally that measures 4 x 4 x 3 mm (series 13, image 109). This is directed inferiorly and slightly posteriorly. ICA termini patent bilaterally. Right A1 widely patent. Left A1 hypoplastic with a moderate to severe short-segment stenosis at its proximal aspect (series 14, image 85). Anterior communicating artery complex irregular without definite discrete aneurysm. Anterior cerebral arteries irregular but patent distally without high-grade stenosis. M1 segments mildly irregular but patent bilaterally without flow-limiting stenosis. Normal MCA bifurcations. Diffuse  small vessel atheromatous irregularity seen throughout the MCA branches  bilaterally. No vascular abnormality seen underlying the large right cerebral hemorrhage. Posterior circulation: Vertebral arteries demonstrate diffuse atheromatous irregularity but are patent to the vertebrobasilar junction without high-grade stenosis. Posterior inferior cerebral arteries patent bilaterally. Basilar diminutive and irregular but patent to its distal aspect without high-grade stenosis. Superior cerebral arteries patent bilaterally. There is a probable small aneurysm arising from the basilar tip measuring approximately 3 mm (series 13, image 113). Fetal type origin of the left PCA supplied via the left posterior communicating artery. Right PCA supplied via a small right P1 segment as well as the right posterior communicating artery. PCAs grossly patent to their distal aspects without high-grade stenosis. Venous sinuses: Not well assessed due to timing of the contrast bolus. Anatomic variants: None significant. Delayed phase: No abnormal enhancement. IMPRESSION: CT HEAD IMPRESSION 1. No significant interval change in size of large intraparenchymal hematoma centered at the right basal ganglia, measuring 8.4 x 4.1 x 4.4 cm. Slightly increased localized vasogenic edema with similar regional mass effect and 10 mm of right-to-left midline shift. 2. Associated intraventricular extension with blood primarily in the lateral and third ventricles, similar to previous. Stable ventricular size and morphology without worsening hydrocephalus or ventricular trapping. 3. No other new acute intracranial abnormality. CTA HEAD IMPRESSION 1. No vascular abnormality seen underlying the large right cerebral hemorrhage. 2. Multiple intracranial aneurysms: 3 mm proximal cavernous left ICA aneurysm, 5 mm distal cavernous left ICA aneurysm, 3 mm supraclinoid left ICA aneurysm, 4 mm proximal cavernous right ICA aneurysm, 4 mm supraclinoid right ICA aneurysm,  and 3 mm basilar tip aneurysm. 3. Underlying moderate to advanced intracranial atherosclerotic change. No proximal high-grade or correctable stenosis identified. Electronically Signed   By: Jeannine Boga M.D.   On: 02/28/2018 05:52   Ct Head Wo Contrast  Result Date: 02/27/2018 CLINICAL DATA:  Follow-up examination for intracranial hemorrhage. EXAM: CT HEAD WITHOUT CONTRAST TECHNIQUE: Contiguous axial images were obtained from the base of the skull through the vertex without intravenous contrast. COMPARISON:  Prior CT from 03/21/2018. FINDINGS: Brain: Large intraparenchymal hemorrhage centered at the right basal ganglia again seen, measuring 8.6 x 3.8 x 4.4 cm (estimated volume 71.9 cc, previously approximately 75 cc). Surrounding low-density vasogenic edema with regional mass effect with up to 10 mm of right-to-left midline shift, relatively similar to previous. Partial basilar cistern effacement similar to previous. Intraventricular extension with blood primarily in the lateral and third ventricles, also similar to previous. Stable ventricular size and morphology without worsening hydrocephalus or ventricular trapping. Remainder of the brain is stable in appearance. No new intracranial hemorrhage. No other acute large vessel territory infarct. No extra-axial collection. Vascular: No asymmetric hyperdense vessel. Skull: Small evolving left parietotemporal scalp contusion calvarium intact. Sinuses/Orbits: Globes and orbital soft tissues demonstrate no acute finding. Scattered mucosal thickening with air-fluid levels noted within the paranasal sinuses. Patient likely intubated. Mastoid air cells remain clear. Other: None. IMPRESSION: 1. Similar to slightly decreased size of large intraparenchymal hematoma centered at the right basal ganglia, estimated volume 71.9 cc on current exam, previously 75 cc. 2. Similar regional mass effect with up to 10 mm of right-to-left midline shift. 3. Associated  intraventricular extension with blood primarily in the lateral and third ventricles, similar to previous. 4. No other new intracranial abnormality. Electronically Signed   By: Jeannine Boga M.D.   On: 02/27/2018 02:59   Ct Head Wo Contrast  Result Date: 03/08/2018 CLINICAL DATA:  53 y/o F; altered level of consciousness. Found unresponsive in a bathtub. EXAM:  CT HEAD WITHOUT CONTRAST CT CERVICAL SPINE WITHOUT CONTRAST TECHNIQUE: Multidetector CT imaging of the head and cervical spine was performed following the standard protocol without intravenous contrast. Multiplanar CT image reconstructions of the cervical spine were also generated. COMPARISON:  01/09/2016 cervical spine radiographs FINDINGS: CT HEAD FINDINGS Brain: Acute hemorrhage centered in the right basal ganglia with extension into the right cerebral hemisphere measuring 8.4 x 3.9 x 4.6 cm (volume = 79 cm^3) (AP x ML x CC series 4, image 24 and series 6, image 38). There is extension of the hematoma into the intraventricular system, predominantly the right lateral ventricle. Associated edema and mass effect results in 10 mm of right-to-left midline shift, right-sided uncal herniation, and partial effacement of the basilar cisterns. No transtentorial or foramen magnum herniation. Vascular: No hyperdense vessel or unexpected calcification. Skull: Normal. Negative for fracture or focal lesion. Sinuses/Orbits: Small paranasal sinus fluid levels. Normal aeration of mastoid air cells. Orbits are unremarkable. Other: None. CT CERVICAL SPINE FINDINGS Alignment: Normal. Skull base and vertebrae: No acute fracture. No primary bone lesion or focal pathologic process. Soft tissues and spinal canal: No prevertebral fluid or swelling. No visible canal hematoma. Disc levels: Mild spondylosis of the cervical spine with predominantly discogenic degenerative changes greatest at the C5-C7 levels. Upper chest: Negative. Other: 15 mm nodule within the left lobe of  the thyroid gland. IMPRESSION: CT head: 1. Acute hemorrhage centered in right basal ganglia with extension into right cerebral hemisphere measuring up to 8.4 cm, 79 CC. 2. Extension of hemorrhage into the intraventricular system, greatest in the right lateral ventricle. 3. Mass effect with 10 mm right-to-left midline shift, right-sided uncal herniation, and partial effacement of basilar cisterns. No downward herniation at this time. CT cervical spine. 1. No acute fracture or dislocation of the cervical spine. 2. 15 mm nodule within left lobe of thyroid gland. Further evaluation with thyroid ultrasound is recommended on a nonemergent basis. 3. Mild spondylosis of the cervical spine. Critical Value/emergent results were called by telephone at the time of interpretation on 02/28/2018 at 5:55 pm to Dr. Nanda Quinton , who verbally acknowledged these results. Electronically Signed   By: Kristine Garbe M.D.   On: 03/17/2018 18:00   Ct Cervical Spine Wo Contrast  Result Date: 03/08/2018 CLINICAL DATA:  53 y/o F; altered level of consciousness. Found unresponsive in a bathtub. EXAM: CT HEAD WITHOUT CONTRAST CT CERVICAL SPINE WITHOUT CONTRAST TECHNIQUE: Multidetector CT imaging of the head and cervical spine was performed following the standard protocol without intravenous contrast. Multiplanar CT image reconstructions of the cervical spine were also generated. COMPARISON:  01/09/2016 cervical spine radiographs FINDINGS: CT HEAD FINDINGS Brain: Acute hemorrhage centered in the right basal ganglia with extension into the right cerebral hemisphere measuring 8.4 x 3.9 x 4.6 cm (volume = 79 cm^3) (AP x ML x CC series 4, image 24 and series 6, image 38). There is extension of the hematoma into the intraventricular system, predominantly the right lateral ventricle. Associated edema and mass effect results in 10 mm of right-to-left midline shift, right-sided uncal herniation, and partial effacement of the basilar cisterns.  No transtentorial or foramen magnum herniation. Vascular: No hyperdense vessel or unexpected calcification. Skull: Normal. Negative for fracture or focal lesion. Sinuses/Orbits: Small paranasal sinus fluid levels. Normal aeration of mastoid air cells. Orbits are unremarkable. Other: None. CT CERVICAL SPINE FINDINGS Alignment: Normal. Skull base and vertebrae: No acute fracture. No primary bone lesion or focal pathologic process. Soft tissues and spinal canal: No  prevertebral fluid or swelling. No visible canal hematoma. Disc levels: Mild spondylosis of the cervical spine with predominantly discogenic degenerative changes greatest at the C5-C7 levels. Upper chest: Negative. Other: 15 mm nodule within the left lobe of the thyroid gland. IMPRESSION: CT head: 1. Acute hemorrhage centered in right basal ganglia with extension into right cerebral hemisphere measuring up to 8.4 cm, 79 CC. 2. Extension of hemorrhage into the intraventricular system, greatest in the right lateral ventricle. 3. Mass effect with 10 mm right-to-left midline shift, right-sided uncal herniation, and partial effacement of basilar cisterns. No downward herniation at this time. CT cervical spine. 1. No acute fracture or dislocation of the cervical spine. 2. 15 mm nodule within left lobe of thyroid gland. Further evaluation with thyroid ultrasound is recommended on a nonemergent basis. 3. Mild spondylosis of the cervical spine. Critical Value/emergent results were called by telephone at the time of interpretation on 03/06/2018 at 5:55 pm to Dr. Nanda Quinton , who verbally acknowledged these results. Electronically Signed   By: Kristine Garbe M.D.   On: 03/23/2018 18:00   Dg Chest Port 1 View  Result Date: 02/28/2018 CLINICAL DATA:  Endotracheal tube and seizures EXAM: PORTABLE CHEST 1 VIEW COMPARISON:  03/11/2018 FINDINGS: Right PICC placed. Tip is at the cavoatrial junction. Endotracheal and NG tubes are stable. Upper normal heart size  allowing for AP technique. Lungs are under aerated with worsening bibasilar atelectasis. IMPRESSION: Right PICC placed.  Tip is at the cavoatrial junction. Worsening bibasilar atelectasis. Electronically Signed   By: Marybelle Killings M.D.   On: 02/28/2018 07:48   Dg Chest Portable 1 View  Result Date: 03/08/2018 CLINICAL DATA:  Endotracheal tube adjustment. EXAM: PORTABLE CHEST 1 VIEW COMPARISON:  02/27/2018 at 1805 hours FINDINGS: Since the earlier exam, the endotracheal tube has been retracted. Tip now projects 1 cm above the Carina. Nasal/orogastric tube is well positioned passing below the diaphragm into the mid stomach. There is significant improvement in lung aeration on the left following endotracheal tube adjustment. There is persistent opacity at the left lung base consistent with pneumonia or atelectasis. Right lung remains clear. IMPRESSION: 1. Endotracheal tube is now well-positioned with subsequent significant improvement in left lung aeration. Electronically Signed   By: Lajean Manes M.D.   On: 02/28/2018 19:14   Dg Chest Portable 1 View  Addendum Date: 03/20/2018   ADDENDUM REPORT: 02/28/2018 18:43 ADDENDUM: The original report was by Dr. Van Clines. The following addendum is by Dr. Van Clines: Critical Value/emergent results were called by telephone at the time of interpretation on 03/19/2018 at 6:43 pm to Dr. Nanda Quinton , who verbally acknowledged these results. Electronically Signed   By: Van Clines M.D.   On: 03/17/2018 18:43   Result Date: 03/11/2018 CLINICAL DATA:  Orogastric tube placement EXAM: PORTABLE CHEST 1 VIEW COMPARISON:  03/03/2018 at 5:09 p.m. FINDINGS: The endotracheal tube is just into the right mainstem bronchus. There is interval atelectasis of most of the left lung. Retraction of 5 cm is recommended. An orogastric tube enters the stomach. The right lung appears clear.  Left heart border obscured. IMPRESSION: 1. Right mainstem bronchus intubation, with  interval atelectasis of much of the left lung. I recommend retracting the endotracheal tube 5 cm and obtaining a repeat chest radiograph. Radiology assistant personnel have been notified to put me in telephone contact with the referring physician or the referring physician's clinical representative in order to discuss these findings. Once this communication is established I will issue an  addendum to this report for documentation purposes. Electronically Signed: By: Van Clines M.D. On: 03/19/2018 18:38   Dg Chest Portable 1 View  Result Date: 02/28/2018 CLINICAL DATA:  Per EMS- pt here from home, was found in a bathtub face up. She was unresponsive and required assisted ventilations with a BVM. Pt now on non rebreather. Hx of ETOH abuse and seizures. No hx of drug abuse. Hx of HTN. Former smoker EXAM: PORTABLE CHEST 1 VIEW COMPARISON:  None. FINDINGS: Cardiac silhouette is normal in size. No mediastinal or hilar masses. No evidence of adenopathy. Lungs are clear. No gross pleural effusion or pneumothorax on this supine study. Skeletal structures are grossly intact. IMPRESSION: No active disease. Electronically Signed   By: Lajean Manes M.D.   On: 03/02/2018 17:20   Dg Abd Portable 1v  Result Date: 03/20/2018 CLINICAL DATA:  Orogastric tube placement. EXAM: PORTABLE ABDOMEN - 1 VIEW COMPARISON:  None. FINDINGS: Nasogastric tube tip projects in mid stomach. The included bowel gas pattern is normal. No radio-opaque calculi or other significant radiographic abnormality are seen. Dense LEFT lung consolidation. IMPRESSION: Nasogastric tube tip projects in mid stomach. Electronically Signed   By: Elon Alas M.D.   On: 03/08/2018 18:36   [place] Ekg Site Rite  Result Date: 02/27/2018 If Site Rite image not attached, placement could not be confirmed due to current cardiac rhythm.  CT head: 1. Acute hemorrhage centered in right basal ganglia with extension into right cerebral hemisphere measuring up to  8.4 cm, 79 CC. 2. Extension of hemorrhage into the intraventricular system, greatest in the right lateral ventricle. 3. Mass effect with 10 mm right-to-left midline shift, right-sided uncal herniation, and partial effacement of basilar cisterns. No downward herniation at this time.  CT cervical spine. 1. No acute fracture or dislocation of the cervical spine. 2. 15 mm nodule within left lobe of thyroid gland. Further evaluation with thyroid ultrasound is recommended on a nonemergent basis. 3. Mild spondylosis of the cervical spine.  CT brain repeat 02/27/18 1. Similar to slightly decreased size of large intraparenchymal hematoma centered at the right basal ganglia, estimated volume 71.9 cc on current exam, previously 75 cc. 2. Similar regional mass effect with up to 10 mm of right-to-left midline shift. 3. Associated intraventricular extension with blood primarily in the lateral and third ventricles, similar to previous. 4. No other new intracranial abnormality.  Assessment: 53 y.o. female with acute right basal ganglia hemorrhage etiology likely hypertensive 1. The hemorrhage measures 75 cc=-slightly improved There is extension into the right cerebral hemisphere and ventricles as well as mass effect with 10 mm right to left midline shift, right sided uncal herniation and partial effacement of the basilar cisterns.        Plan: continue hypertonic saline with serum sodium goal 150-155. Strict blood pressure control with s Maintain normothermia, euglycemia and euvolemic.Marland Kitchen Long d/w sister at bedside and answered questions about prognosis. D/w Dr Vertell Limber and Dr. Doyne Keel. This patient is critically ill and at significant risk of neurological worsening, death and care requires constant monitoring of vital signs, hemodynamics,respiratory and cardiac monitoring, extensive review of multiple databases, frequent neurological assessment, discussion with family, other specialists and medical  decision making of high complexity.I have made any additions or clarifications directly to the above note.This critical care time does not reflect procedure time, or teaching time or supervisory time of PA/NP/Med Resident etc but could involve care discussion time.  I spent 35 minutes of neurocritical care time  in the  care of  this patient.     Antony Contras, MD Medical Director [place] Pager: [phone] 02/28/2018 5:13 PM  02/28/2018, 5:13 PM

## 2018-02-28 NOTE — Progress Notes (Signed)
OT Cancellation Note  Patient Details Name: Jacqueline Pratt MRN: [id number] DOB: 08/20/1965   Cancelled Treatment:    Reason Eval/Treat Not Completed: Active bedrest order;Patient not medically ready. Pt intubated and sedated.  Will follow and initiate OT eval when medically appropriate and able.   Delight Stare, OT Acute Rehabilitation Services Pager [phone] Office [phone]    Delight Stare 02/28/2018, 7:59 AM

## 2018-02-28 NOTE — Progress Notes (Signed)
Physical medicine rehabilitation consult requested chart reviewed. Patient currently intubated. Hold on formal rehabilitation consult at this time

## 2018-02-28 NOTE — Progress Notes (Signed)
Patient transported to CT and back to 2E10 with no complications. Vitals stable.

## 2018-02-28 NOTE — Progress Notes (Signed)
SLP Cancellation Note  Patient Details Name: Jacqueline Pratt MRN: [id number] DOB: Jul 27, 1965   Cancelled treatment:       Reason Eval/Treat Not Completed: Patient not medically ready. Pt currently intubated. Will follow up when appropriate.  Deneise Lever, [place], CCC-SLP Speech-Language Pathologist Acute Rehabilitation Services Pager: [phone] Office: [phone]    Aliene Altes 02/28/2018, 3:20 PM

## 2018-02-28 NOTE — Progress Notes (Signed)
   NAME:  Jacqueline Pratt, MRN:  [id number], DOB:  [birth year], LOS: 2 ADMISSION DATE:  03/19/2018, CONSULTATION DATE:  02/25/2018 REFERRING MD:  Kerney Elbe, CHIEF COMPLAINT:  Found down   Brief History   2F found down, 8 cm basal ganglia hemorrhage. Intubated in ED   Past Medical History  Alcohol abuse, Hypertension & Seizures  Significant Hospital Events   1/04 Admit with AMS 1/6 remains on Cleviprex. Agitated at times. No sig change   Consults:  PCCM  Procedures:  ETT 1/4 >>  Significant Diagnostic Tests:  1/04 CT Head >> 8.4 cm hemorrhage in right basal ganglia with right sided uncal hernation  Micro Data:     Antimicrobials:     Subjective:  No events overnight Objective   Blood pressure (Abnormal) 158/72, pulse 70, temperature 98.8 F (37.1 C), resp. rate 18, height 5\' 2"  (1.575 m), weight 91.2 kg, SpO2 98 %.    Vent Mode: PRVC FiO2 (%):  [30 %-40 %] 40 % Set Rate:  [18 bmp] 18 bmp Vt Set:  [450 mL] 450 mL PEEP:  [5 cmH20] 5 cmH20 Plateau Pressure:  [18 cmH20-22 cmH20] 18 cmH20   Intake/Output Summary (Last 24 hours) at 02/28/2018 1153 Last data filed at 02/28/2018 1100 Gross per 24 hour  Intake 3277.55 ml  Output 380 ml  Net 2897.55 ml   Filed Weights   03/17/2018 2300  Weight: 91.2 kg    Examination:  General: This a 53 year old female she is currently after stimulation HEENT normocephalic atraumatic orally intubated Pulmonary: Clear to auscultation diminished bases no accessory use Cardiac: Regular rate and rhythm Abdomen: Soft non-tender Extremities: Warm dry no significant edema scattered areas of ecchymosis Neuro: Purposeful, left-sided hemiparesis GU: Clear yellow    ag metabolic acidosis Renal failure (AKI resolved 1/5) rhabdo  Leukocytosis  Assessment & Plan:   53 year old female history of alcohol abuse, hypertension, and seizures who was brought in after being found down with unknown last normal. Found to have 8.4 cm right basal  ganglia hemorrhage.  Right basal ganglia hemorrhage Malignant hypertension -family declined surgery  P: Cont HT saline w/ goal Na 150-155 Cont Cleviprex for goal SBP < 140 Serial neuro checks  Acute Respiratory Insufficiency in setting of ICH   -intubation for airway protection P: Cont full vent support VAP bundle  Daily assessment for weaning  Repeating abg  Resolved AKI 2/2 Rhabdo tCKs improved.  P: Trend chem Cont IVFs   Fluid and electrolyte imbalance: Hypernatremia (therapeutic) P: Cont HT protocol   Alcohol abuse P: Cont thiamine and folate   Anemia  -no evidence of bleeding currently  P: Trend cbc  Best practice:  Diet: NPO Pain/Anxiety/Delirium protocol (if indicated): fentanyl VAP protocol (if indicated): ordered DVT prophylaxis: contraindicated with bleed, scd's GI prophylaxis: PPI Glucose control: sliding scale insulin Mobility: contraindicated at this time Code Status: full code Family Communication: No family at bedside on NP rounds 1/5.  Will update on arrival.   Disposition: ICU under neurology care    Critical care time:  43 min     Erick Colace ACNP-BC [place] Pager # [phone] OR # [phone] if no answer

## 2018-02-28 NOTE — Progress Notes (Signed)
Initial Nutrition Assessment  DOCUMENTATION CODES:   Obesity unspecified  INTERVENTION:   Initiate Vital High Protein @ 45 ml/hr (1080 ml/day) via OG tube 30 ml Prostat daily MVI daily  Provides: 1180 kcal, 109 grams protein, and 902 ml free water.  TF regimen and propofol at current rate providing 1276 total kcal/day    NUTRITION DIAGNOSIS:   Inadequate oral intake related to inability to eat as evidenced by NPO status.  GOAL:   Provide needs based on ASPEN/SCCM guidelines  MONITOR:   Vent status, TF tolerance  REASON FOR ASSESSMENT:   Consult, Ventilator Enteral/tube feeding initiation and management  ASSESSMENT:   Pt with PMH of ETOH abuse, uncontrolled HTN, Sz, anxiety, and depression now admitted 1/4 after being found down with an 8 cm basal ganglia hemorrhage.    Pt's family declined neurosurgery intervention, pt with medical management of hemorrhage with cleviprex and hypertonic saline.   Patient is currently intubated on ventilator support Temp (24hrs), Avg:98.7 F (37.1 C), Min:97.7 F (36.5 C), Max:99.5 F (37.5 C)  Propofol: off Cleviprex: 2 ml/hr provides: 96 kcal per day Medications reviewed and include: folic acid, SSI, senokot, thiamine, 3% hypertonic saline Labs reviewed: 156 (H) - on hypertonic saline   BP: 163/69 MAP: 93   I/O: +3800 ml since admit UOP: 474 ml x 24 hrs   NUTRITION - FOCUSED PHYSICAL EXAM:    Most Recent Value  Orbital Region  No depletion  Upper Arm Region  No depletion  Thoracic and Lumbar Region  No depletion  Buccal Region  Unable to assess  Temple Region  No depletion  Clavicle Bone Region  No depletion  Clavicle and Acromion Bone Region  No depletion  Scapular Bone Region  Unable to assess  Dorsal Hand  No depletion  Patellar Region  No depletion  Anterior Thigh Region  No depletion  Posterior Calf Region  No depletion  Edema (RD Assessment)  Mild  Hair  Reviewed  Eyes  Unable to assess  Mouth  Unable to  assess  Skin  Reviewed  Nails  Reviewed       Diet Order:   Diet Order            Diet NPO time specified  Diet effective now              EDUCATION NEEDS:   No education needs have been identified at this time  Skin:  Skin Assessment: Reviewed RN Assessment  Last BM:  1/4 (PTA)  Height:   Ht Readings from Last 1 Encounters:  02/24/2018 5\' 2"  (1.575 m)    Weight:   Wt Readings from Last 1 Encounters:  02/28/2018 91.2 kg    Ideal Body Weight:  50 kg  BMI:  Body mass index is 36.77 kg/m.  Estimated Nutritional Needs:   Kcal:  2637-8588  Protein:  >100 grams  Fluid:  >1.5 L/day  Maylon Peppers RD, LDN, CNSC [phone] Pager [phone] After Hours Pager

## 2018-02-28 NOTE — Progress Notes (Signed)
PT Cancellation Note  Patient Details Name: Jacqueline Pratt MRN: [id number] DOB: 1965/10/05   Cancelled Treatment:    Reason Eval/Treat Not Completed: Patient not medically ready. Pt remains intubated and sedated due to agitation. Pt also with noted elevated troponin and remains on 3%. PT to return as able, as appropriate to complete PT eval.  Kittie [place], PT, DPT Acute Rehabilitation Services Pager #: [phone] Office #: [phone]    Berline Lopes 02/28/2018, 10:39 AM

## 2018-03-01 ENCOUNTER — Inpatient Hospital Stay (HOSPITAL_COMMUNITY): Payer: Managed Care, Other (non HMO)

## 2018-03-01 LAB — SODIUM
Sodium: 151 mmol/L — ABNORMAL HIGH (ref 135–145)
Sodium: 152 mmol/L — ABNORMAL HIGH (ref 135–145)

## 2018-03-01 LAB — COMPREHENSIVE METABOLIC PANEL
ALT: 49 U/L — ABNORMAL HIGH (ref 0–44)
ANION GAP: 7 (ref 5–15)
AST: 59 U/L — ABNORMAL HIGH (ref 15–41)
Albumin: 3 g/dL — ABNORMAL LOW (ref 3.5–5.0)
Alkaline Phosphatase: 67 U/L (ref 38–126)
BUN: 16 mg/dL (ref 6–20)
CO2: 26 mmol/L (ref 22–32)
Calcium: 8.6 mg/dL — ABNORMAL LOW (ref 8.9–10.3)
Chloride: 119 mmol/L — ABNORMAL HIGH (ref 98–111)
Creatinine, Ser: 0.92 mg/dL (ref 0.44–1.00)
GFR calc Af Amer: >60 mL/min (ref >60)
GFR calc non Af Amer: >60 mL/min (ref >60)
GLUCOSE: 142 mg/dL — AB (ref 70–99)
Potassium: 2.5 mmol/L — CL (ref 3.5–5.1)
SODIUM: 152 mmol/L — AB (ref 135–145)
Total Bilirubin: 0.4 mg/dL (ref 0.3–1.2)
Total Protein: 6.1 g/dL — ABNORMAL LOW (ref 6.5–8.1)

## 2018-03-01 LAB — CBC
HCT: 36.4 % (ref 36.0–46.0)
Hemoglobin: 11.6 g/dL — ABNORMAL LOW (ref 12.0–15.0)
MCH: 30.3 pg (ref 26.0–34.0)
MCHC: 31.9 g/dL (ref 30.0–36.0)
MCV: 95 fL (ref 80.0–100.0)
Platelets: 212 10*3/uL (ref 150–400)
RBC: 3.83 MIL/uL — ABNORMAL LOW (ref 3.87–5.11)
RDW: 13.8 % (ref 11.5–15.5)
WBC: 12.9 10*3/uL — ABNORMAL HIGH (ref 4.0–10.5)
nRBC: 0 % (ref 0.0–0.2)

## 2018-03-01 LAB — BASIC METABOLIC PANEL
Anion gap: 9 (ref 5–15)
BUN: 19 mg/dL (ref 6–20)
CHLORIDE: 120 mmol/L — AB (ref 98–111)
CO2: 25 mmol/L (ref 22–32)
Calcium: 9.2 mg/dL (ref 8.9–10.3)
Creatinine, Ser: 0.83 mg/dL (ref 0.44–1.00)
GFR calc Af Amer: >60 mL/min (ref >60)
GFR calc non Af Amer: >60 mL/min (ref >60)
Glucose, Bld: 118 mg/dL — ABNORMAL HIGH (ref 70–99)
Potassium: 3.5 mmol/L (ref 3.5–5.1)
Sodium: 154 mmol/L — ABNORMAL HIGH (ref 135–145)

## 2018-03-01 LAB — MAGNESIUM: Magnesium: 2 mg/dL (ref 1.7–2.4)

## 2018-03-01 LAB — GLUCOSE, CAPILLARY
GLUCOSE-CAPILLARY: 101 mg/dL — AB (ref 70–99)
Glucose-Capillary: 109 mg/dL — ABNORMAL HIGH (ref 70–99)
Glucose-Capillary: 126 mg/dL — ABNORMAL HIGH (ref 70–99)
Glucose-Capillary: 132 mg/dL — ABNORMAL HIGH (ref 70–99)
Glucose-Capillary: 149 mg/dL — ABNORMAL HIGH (ref 70–99)
Glucose-Capillary: 149 mg/dL — ABNORMAL HIGH (ref 70–99)
Glucose-Capillary: 91 mg/dL (ref 70–99)

## 2018-03-01 LAB — PHOSPHORUS: Phosphorus: 2 mg/dL — ABNORMAL LOW (ref 2.5–4.6)

## 2018-03-01 MED ORDER — POTASSIUM CHLORIDE 20 MEQ/15ML (10%) PO SOLN
40.0000 meq | ORAL | Status: AC
  Administered 2018-03-01 (×3): 40 meq
  Filled 2018-03-01 (×3): qty 30

## 2018-03-01 NOTE — Progress Notes (Signed)
NAME:  Jacqueline Pratt, MRN:  [id number], DOB:  1965/10/02, LOS: 3 ADMISSION DATE:  02/24/2018, CONSULTATION DATE:  03/17/2018 REFERRING MD:  Kerney Elbe, CHIEF COMPLAINT:  Found down   Brief History   62F found down, 8 cm basal ganglia hemorrhage. Intubated in ED   Past Medical History  Alcohol abuse, Hypertension & Seizures  Significant Hospital Events   1/04 Admit with AMS 1/6 remains on Cleviprex. Agitated at times. No sig change  1/7: Weaning.  Neuro exam the same.  Blood pressure controlled. 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  No significant change in large intraparenchymal hematoma in the right basal ganglia slight increase in localized vasogenic edema ongoing mass-effect right to left shift CT brain 1/7: Micro Data:     Antimicrobials:     Subjective:  No events overnight Objective   Blood pressure (Abnormal) 145/64, pulse 63, temperature 98.2 F (36.8 C), temperature source Axillary, resp. rate 19, height 5\' 2"  (1.575 m), weight 89.3 kg, SpO2 98 %.    Vent Mode: CPAP;PSV FiO2 (%):  [40 %] 40 % Set Rate:  [18 bmp] 18 bmp Vt Set:  [450 mL] 450 mL PEEP:  [5 cmH20] 5 cmH20 Pressure Support:  [5 EUM35-36 cmH20] 8 cmH20 Plateau Pressure:  [11 cmH20-22 cmH20] 22 cmH20   Intake/Output Summary (Last 24 hours) at 03/01/2018 1033 Last data filed at 03/01/2018 1000 Gross per 24 hour  Intake 2145.04 ml  Output 6150 ml  Net -4004.96 ml   Filed Weights   03/11/2018 2300 03/01/18 0500  Weight: 91.2 kg 89.3 kg    Examination:  General: 53 year old female patient she is currently resting in bed and in no acute distress HEENT normocephalic atraumatic no jugular venous  distention orally intubated mucous membranes moist Pulmonary: Coarse scattered rhonchi no accessory use tidal volumes excellent on spontaneous breathing trial Cardiac: Regular rate and rhythm Abdomen: Soft nontender Extremities: Warm, dry. Neuro: Awake, follows commands, will wiggle toes on right foot, will reach with right arm but not consistently follow commands on the right. GU: Clear yellow    ag metabolic acidosis Renal failure (AKI resolved 1/5) rhabdo  Leukocytosis  Assessment & Plan:   53 year old female history of alcohol abuse, hypertension, and seizures who was brought in after being found down with unknown last normal. Found to have 8.4 cm right basal ganglia hemorrhage.  Right basal ganglia hemorrhage Malignant hypertension -family declined surgery  P: Sodium goal 1/44/3154 Systolic blood pressure goal less than 140 Serial neuro checks Repeat CT head 1/8   Acute Respiratory Insufficiency in setting of ICH   -intubation for airway protection Portable chest x-ray personally reviewed: Endotracheal tubes in satisfactory position she does have new right lower lobe volume loss/airspace disease this could reflect new infiltrate versus atelectasis  p: Pressure support ventilation as tolerated PAD protocol RASS goal 0 to -1 changing to PRN fentanyl from drip VAP bundle Evaluate for extubation on 1/8 assuming CT head stable Sputum culture today Repeat chest x-ray a.m.  Fluid and electrolyte imbalance: Hypernatremia, Hyperkalemia  (therapeutic)->protocol stopped 1/6 after Na was > 160,  P: Serial Na checks Replace K and recheck this afternoon and in am   Alcohol abuse P: Cont thiamine  and folate Stop scheduled ativan; change to PRN  Anemia  -no evidence of bleeding currently  P: Trend cbc  Best practice:  Diet: NPO Pain/Anxiety/Delirium protocol (if indicated): fentanyl VAP protocol (if indicated): ordered DVT prophylaxis: contraindicated with bleed,  scd's GI prophylaxis: PPI Glucose control: sliding scale insulin Mobility: contraindicated at this time Code Status: full code Family Communication: No family at bedside on NP rounds 1/5.  Will update on arrival.   Disposition: ICU under neurology care    Critical care time:  32 minutes    Erick Colace ACNP-BC [place] Pager # [phone] OR # [phone] if no answer

## 2018-03-01 NOTE — Progress Notes (Signed)
Pharmacy is unable to confirm the medications the patient was taking at home. All options have been exhausted and a resolution to the situation is not expected.   Where possible, their outpatient pharmacy(s) have been contacted for the last time prescriptions were filled and that information has been added to each medication in an Order Note (highlighted yellow below the medication).  Please contact pharmacy if further assistance is needed.   Romeo Rabon, PharmD. Mobile: [phone]. 03/01/2018,2:23 PM.

## 2018-03-01 NOTE — Progress Notes (Signed)
PT Cancellation Note/ Discharge  Patient Details Name: Jacqueline Pratt MRN: [id number] DOB: 1965-05-02   Cancelled Treatment:    Reason Eval/Treat Not Completed: Patient not medically ready. Pt remains intubated, sedated on bedrest. At this time will sign off and await new order when pt medically appropriate.   Carr Shartzer B Lelania Bia 03/01/2018, 7:11 AM  Elwyn Reach, PT Acute Rehabilitation Services Pager: [phone] Office: [phone]

## 2018-03-01 NOTE — Progress Notes (Signed)
[facility] ADULT ICU REPLACEMENT PROTOCOL FOR AM LAB REPLACEMENT ONLY  The patient does apply for the [facility] Adult ICU Electrolyte Replacment Protocol based on the criteria listed below:   1. Is GFR >/= 40 ml/min? Yes.    Patient's GFR today is >60 2. Is urine output >/= 0.5 ml/kg/hr for the last 6 hours? Yes.   Patient's UOP is 0.93 ml/kg/hr 3. Is BUN < 60 mg/dL? Yes.    Patient's BUN today is 16 4. Abnormal electrolyte  K 2.5 5. Ordered repletion with: per protocol 6. If a panic level lab has been reported, has the CCM MD in charge been notified? Yes.  .   Physician:  Illene Labrador, Canary Brim 03/01/2018 6:40 AM

## 2018-03-01 NOTE — Progress Notes (Signed)
OT Cancellation Note  Patient Details Name: CURTISTINE PETTITT MRN: [id number] DOB: 08/10/1965   Cancelled Treatment:    Reason Eval/Treat Not Completed: Active bedrest order;Patient not medically ready. Pt remains intubated, sedated, and on bedrest.  OT will sign off and await new orders when medically appropriate.   Delight Stare, OT Acute Rehabilitation Services Pager [phone] Office [phone]    Delight Stare 03/01/2018, 7:23 AM

## 2018-03-01 NOTE — Progress Notes (Signed)
STROKE TEAM PROGRESS NOTE   SUBJECTIVE :    her sister, is at the bedside. She remains on sedation.  She  is on hypertonic saline and sodium level is 151 this am  She is arousable and follows occasional commands  Past Medical History:  Diagnosis Date  . Anxiety   . Depression   . Hypertension   . Migraines   . Seizures ([place])    last seizure 3 years ago per pt    Past Surgical History:  Procedure Laterality Date  . EXAM UNDER ANESTHESIA WITH MANIPULATION OF SHOULDER    . INCISION AND DRAINAGE     left lower side- staph infection  . STAPEDES SURGERY      Family History  Problem Relation Age of Onset  . Diabetes Mother   . Non-Hodgkin's lymphoma Sister   . Rectal cancer Maternal Grandfather   . Hypertension Maternal Grandfather   . Diabetes Maternal Grandfather   . Colon cancer Neg Hx    Social History:  reports that she has quit smoking. She has never used smokeless tobacco. She reports current alcohol use of about 14.0 standard drinks of alcohol per week. She reports that she does not use drugs.  Allergies:  Allergies  Allergen Reactions  . Aspirin Other (See Comments) and Nausea And Vomiting  . Lovastatin Diarrhea  . Simvastatin Diarrhea  . Statins Other (See Comments)    Diarrhea   . Tetracyclines & Related Nausea Only    Home Medications   ROS: Unable to obtain due to sedation  Physical Examination: Blood pressure (!) 173/70, pulse 64, temperature 99.2 F (37.3 C), temperature source Axillary, resp. rate (!) 22, height 5\' 2"  (1.575 m), weight 89.3 kg, SpO2 96 %.  HEENT-  [place]/AT  Lungs - Intubated on ventilator Extremities - Warm and well perfused  Neurologic Examination: Mental Status: Intubated and sedated  . No eye opening spontaneously or to command. Flails RUE and RLE to sternal rub. Trace withdrawal on the left upper extremity and lower extremity. Cranial Nerves: II:  Pupils 35mm bilaterally and unreactive (sedated)- left slugglish. No blink to  threat.  Fundi not visualized III,IV, VI: No doll's eye reflex (sedated). Positive corneal reflexes  VII: Face flaccidly symmetric.  VIII: No response to voice IX,X: Intubated XI: Unable to assess XII: positive gag with introduction of inline cath. Mildly delayed Motor/Sensory: RUE: Flails nonpurposefully to noxious stimuli.  RLE: Withdraws to noxious and flails nonpurposefully intermittently LUE: Increased extensor tone.no movement with noxious stimuli LLE: Increased extensor tone. Intermittent Withdrawal to painMuscle bulk is normal x 4  Cerebellar/Gait: Unable to assess   Results for orders placed or performed during the hospital encounter of 02/27/2018 (from the past 48 hour(s))  Glucose, capillary     Status: Abnormal   Collection Time: 02/27/18  4:10 PM  Result Value Ref Range   Glucose-Capillary 137 (H) 70 - 99 mg/dL   Comment 1 Notify RN    Comment 2 Document in Chart   Sodium     Status: None   Collection Time: 02/27/18  4:35 PM  Result Value Ref Range   Sodium 143 135 - 145 mmol/L    Comment: Performed at [facility], [place] [address], [place], [place] [address]  Glucose, capillary     Status: Abnormal   Collection Time: 02/27/18  7:45 PM  Result Value Ref Range   Glucose-Capillary 114 (H) 70 - 99 mg/dL  Basic metabolic panel     Status:  Abnormal   Collection Time: 02/27/18  9:46 PM  Result Value Ref Range   Sodium 148 (H) 135 - 145 mmol/L   Potassium 3.4 (L) 3.5 - 5.1 mmol/L   Chloride 120 (H) 98 - 111 mmol/L   CO2 22 22 - 32 mmol/L   Glucose, Bld 106 (H) 70 - 99 mg/dL   BUN 14 6 - 20 mg/dL   Creatinine, Ser 0.88 0.44 - 1.00 mg/dL   Calcium 8.2 (L) 8.9 - 10.3 mg/dL   GFR calc non Af Amer >60 >60 mL/min   GFR calc Af Amer >60 >60 mL/min   Anion gap 6 5 - 15    Comment: Performed at [facility] [address] [place], [place] [address]  CK total and CKMB (cardiac)not at [facility]     Status: Abnormal   Collection Time: 02/27/18  9:46 PM  Result Value  Ref Range   Total CK 2,451 (H) 38 - 234 U/L   CK, MB 12.5 (H) 0.5 - 5.0 ng/mL   Relative Index 0.5 0.0 - 2.5    Comment: Performed at [facility] [address], [place], [place] [address]  Glucose, capillary     Status: Abnormal   Collection Time: 02/27/18 11:19 PM  Result Value Ref Range   Glucose-Capillary 105 (H) 70 - 99 mg/dL  Glucose, capillary     Status: Abnormal   Collection Time: 02/28/18  3:36 AM  Result Value Ref Range   Glucose-Capillary 106 (H) 70 - 99 mg/dL  Basic metabolic panel     Status: Abnormal   Collection Time: 02/28/18  3:40 AM  Result Value Ref Range   Sodium 151 (H) 135 - 145 mmol/L   Potassium 3.7 3.5 - 5.1 mmol/L   Chloride 125 (H) 98 - 111 mmol/L   CO2 21 (L) 22 - 32 mmol/L   Glucose, Bld 128 (H) 70 - 99 mg/dL   BUN 16 6 - 20 mg/dL   Creatinine, Ser 0.90 0.44 - 1.00 mg/dL   Calcium 8.0 (L) 8.9 - 10.3 mg/dL   GFR calc non Af Amer >60 >60 mL/min   GFR calc Af Amer >60 >60 mL/min   Anion gap 5 5 - 15    Comment: Performed at [facility], [place] [address] [place], [place] [address]  CK     Status: Abnormal   Collection Time: 02/28/18  3:40 AM  Result Value Ref Range   Total CK 1,970 (H) 38 - 234 U/L    Comment: Performed at [facility], [place] [address], [place], [place] [address]  CBC     Status: Abnormal   Collection Time: 02/28/18  5:30 AM  Result Value Ref Range   WBC 8.4 4.0 - 10.5 K/uL    Comment: REPEATED TO VERIFY   RBC 3.62 (L) 3.87 - 5.11 MIL/uL   Hemoglobin 10.7 (L) 12.0 - 15.0 g/dL    Comment: REPEATED TO VERIFY RESULTS VERIFIED BY RECOLLECT    HCT 34.4 (L) 36.0 - 46.0 %   MCV 95.0 80.0 - 100.0 fL    Comment: REPEATED TO VERIFY   MCH 29.6 26.0 - 34.0 pg   MCHC 31.1 30.0 - 36.0 g/dL   RDW 13.2 11.5 - 15.5 %   Platelets 165 150 - 400 K/uL    Comment: REPEATED TO VERIFY   nRBC 0.0 0.0 - 0.2 %    Comment: Performed at [facility], [place] [address]  [place], [place] [address]  Glucose, capillary     Status:  Abnormal   Collection Time: 02/28/18  7:54 AM  Result Value Ref Range   Glucose-Capillary 108 (H) 70 - 99 mg/dL   Comment 1 Notify RN    Comment 2 Document in Chart   Sodium     Status: Abnormal   Collection Time: 02/28/18  8:01 AM  Result Value Ref Range   Sodium 156 (H) 135 - 145 mmol/L    Comment: Performed at [facility], [place] [address], [place], [place] [address]  Magnesium     Status: None   Collection Time: 02/28/18 10:25 AM  Result Value Ref Range   Magnesium 2.1 1.7 - 2.4 mg/dL    Comment: Performed at [facility], [place] [address], [place], [place] [address]  Phosphorus     Status: Abnormal   Collection Time: 02/28/18 10:25 AM  Result Value Ref Range   Phosphorus 1.2 (L) 2.5 - 4.6 mg/dL    Comment: Performed at [facility] [address] [place], [place] [address]  Glucose, capillary     Status: None   Collection Time: 02/28/18 11:42 AM  Result Value Ref Range   Glucose-Capillary 95 70 - 99 mg/dL   Comment 1 Notify RN    Comment 2 Document in Chart   I-STAT 3, arterial blood gas (G3+)     Status: Abnormal   Collection Time: 02/28/18  1:48 PM  Result Value Ref Range   pH, Arterial 7.410 7.350 - 7.450   pCO2 arterial 34.0 32.0 - 48.0 mmHg   pO2, Arterial 78.0 (L) 83.0 - 108.0 mmHg   Bicarbonate 21.5 20.0 - 28.0 mmol/L   TCO2 22 22 - 32 mmol/L   O2 Saturation 95.0 %   Acid-base deficit 2.0 0.0 - 2.0 mmol/L   Patient temperature 99.3 F    Collection site RADIAL, ALLEN'S TEST ACCEPTABLE    Drawn by RT    Sample type ARTERIAL   Glucose, capillary     Status: Abnormal   Collection Time: 02/28/18  4:02 PM  Result Value Ref Range   Glucose-Capillary 129 (H) 70 - 99 mg/dL   Comment 1 Notify RN    Comment 2 Document in Chart   Sodium     Status: Abnormal   Collection Time: 02/28/18  4:30 PM  Result Value Ref Range   Sodium 169 (HH) 135 - 145 mmol/L    Comment: CRITICAL RESULT CALLED TO, READ BACK BY AND VERIFIED WITH: E REEN,RN 1719 02/28/2017  WBOND 02/28/2018 Performed at [facility], [place] [address], [place], [place] [address] CORRECTED ON 01/06 AT 2310: PREVIOUSLY REPORTED AS 169 CRITICAL RESULT CALLED TO, READ BACK BY AND VERIFIED WITH: E REEN,RN 1719 02/28/2017 WBOND   Magnesium     Status: None   Collection Time: 02/28/18  5:26 PM  Result Value Ref Range   Magnesium 2.0 1.7 - 2.4 mg/dL    Comment: Performed at [facility], [place] [address] [place], [place] [address]  Phosphorus     Status: Abnormal   Collection Time: 02/28/18  5:26 PM  Result Value Ref Range   Phosphorus 1.5 (L) 2.5 - 4.6 mg/dL    Comment: Performed at [facility] [address], [place], [place] [address]  Glucose, capillary     Status: Abnormal   Collection Time: 02/28/18  8:04 PM  Result Value Ref Range   Glucose-Capillary 118 (H) 70 -  99 mg/dL  Glucose, capillary     Status: Abnormal   Collection Time: 02/28/18 11:34 PM  Result Value Ref Range   Glucose-Capillary 126 (H) 70 - 99 mg/dL  Sodium     Status: Abnormal   Collection Time: 03/01/18  1:14 AM  Result Value Ref Range   Sodium 151 (H) 135 - 145 mmol/L    Comment: Performed at [facility] [address], [place], [place] [address]  Glucose, capillary     Status: Abnormal   Collection Time: 03/01/18  3:26 AM  Result Value Ref Range   Glucose-Capillary 149 (H) 70 - 99 mg/dL  Magnesium     Status: None   Collection Time: 03/01/18  5:11 AM  Result Value Ref Range   Magnesium 2.0 1.7 - 2.4 mg/dL    Comment: Performed at [facility], [place] [address] [place], [place] [address]  Phosphorus     Status: Abnormal   Collection Time: 03/01/18  5:11 AM  Result Value Ref Range   Phosphorus 2.0 (L) 2.5 - 4.6 mg/dL    Comment: Performed at [facility] [address] [place], [place] [address]  CBC     Status: Abnormal   Collection Time: 03/01/18  5:11 AM  Result Value Ref Range   WBC 12.9 (H) 4.0 - 10.5 K/uL   RBC 3.83 (L) 3.87 - 5.11 MIL/uL    Hemoglobin 11.6 (L) 12.0 - 15.0 g/dL   HCT 36.4 36.0 - 46.0 %   MCV 95.0 80.0 - 100.0 fL   MCH 30.3 26.0 - 34.0 pg   MCHC 31.9 30.0 - 36.0 g/dL   RDW 13.8 11.5 - 15.5 %   Platelets 212 150 - 400 K/uL   nRBC 0.0 0.0 - 0.2 %    Comment: Performed at [facility], [place] [address] [place], [place] [address]  Comprehensive metabolic panel     Status: Abnormal   Collection Time: 03/01/18  5:11 AM  Result Value Ref Range   Sodium 152 (H) 135 - 145 mmol/L   Potassium 2.5 (LL) 3.5 - 5.1 mmol/L    Comment: CRITICAL RESULT CALLED TO, READ BACK BY AND VERIFIED WITH: EVERETTE D,RN 03/01/18 0622 WAYK    Chloride 119 (H) 98 - 111 mmol/L   CO2 26 22 - 32 mmol/L   Glucose, Bld 142 (H) 70 - 99 mg/dL   BUN 16 6 - 20 mg/dL   Creatinine, Ser 0.92 0.44 - 1.00 mg/dL   Calcium 8.6 (L) 8.9 - 10.3 mg/dL   Total Protein 6.1 (L) 6.5 - 8.1 g/dL   Albumin 3.0 (L) 3.5 - 5.0 g/dL   AST 59 (H) 15 - 41 U/L   ALT 49 (H) 0 - 44 U/L   Alkaline Phosphatase 67 38 - 126 U/L   Total Bilirubin 0.4 0.3 - 1.2 mg/dL   GFR calc non Af Amer >60 >60 mL/min   GFR calc Af Amer >60 >60 mL/min   Anion gap 7 5 - 15    Comment: Performed at [facility], [place] [address], [place], [place] [address]  Glucose, capillary     Status: Abnormal   Collection Time: 03/01/18  8:06 AM  Result Value Ref Range   Glucose-Capillary 132 (H) 70 - 99 mg/dL   Comment 1 Notify RN    Comment 2 Document in Chart   Culture, respiratory (non-expectorated)     Status: None (Preliminary result)   Collection Time:  03/01/18 11:23 AM  Result Value Ref Range   Specimen Description TRACHEAL ASPIRATE    Special Requests NONE    Gram Stain      MODERATE WBC PRESENT, PREDOMINANTLY PMN MODERATE GRAM POSITIVE COCCI Performed at [facility], [address] [place], [place] [address]    Culture PENDING    Report Status PENDING   Glucose, capillary     Status: Abnormal   Collection Time: 03/01/18 12:00 PM  Result Value Ref Range    Glucose-Capillary 109 (H) 70 - 99 mg/dL   Comment 1 Notify RN    Comment 2 Document in Chart   Sodium     Status: Abnormal   Collection Time: 03/01/18  2:12 PM  Result Value Ref Range   Sodium 152 (H) 135 - 145 mmol/L    Comment: Performed at [facility], [place] [address] [place], [place] [address]  Glucose, capillary     Status: Abnormal   Collection Time: 03/01/18  3:05 PM  Result Value Ref Range   Glucose-Capillary 149 (H) 70 - 99 mg/dL   Comment 1 Notify RN    Comment 2 Document in Chart    Ct Angio Head W Or Wo Contrast  Result Date: 02/28/2018 CLINICAL DATA:  Follow-up examination for intracranial hemorrhage. EXAM: CT ANGIOGRAPHY HEAD TECHNIQUE: Multidetector CT imaging of the head was performed using the standard protocol during bolus administration of intravenous contrast. Multiplanar CT image reconstructions and MIPs were obtained to evaluate the vascular anatomy. CONTRAST:  <See Chart> ISOVUE-370 IOPAMIDOL (ISOVUE-370) INJECTION 76% COMPARISON:  Prior CT from 02/27/2018. FINDINGS: CT HEAD Brain: Large intraparenchymal hemorrhage centered at the right basal ganglia relatively unchanged in size, measuring 8.4 x 4.1 x 4.4 cm, stable when measured at similar levels on previous exam. Surrounding low-density vasogenic edema has increased and is now more hypodense and well-defined as compared to previous exam. Associated regional mass effect with up to 10 mm of right-to-left midline shift, stable. Right lateral ventricle is partially effaced. Intraventricular extension with blood in the lateral and third ventricles, relatively similar. Relatively stable ventricular size and morphology without worsening hydrocephalus or ventricular entrapment. Partial basilar cistern effacement similar. No transtentorial herniation. No other new intracranial hemorrhage. No acute large vessel territory infarct. No extra-axial fluid collection. Vascular: No hyperdense vessel. Skull: Resolving left scalp  contusion.  Calvarium unchanged. Sinuses: Moderate mucosal thickening throughout the paranasal sinuses with associated air-fluid levels. Fluid within the nasopharynx. Patient is intubated. Small bilateral mastoid effusions noted. Orbits: Globes and orbital soft tissues demonstrate no acute finding. CTA HEAD Anterior circulation: Visualized distal cervical segments of the internal carotid arteries are tortuous and mildly irregular but patent bilaterally. Petrous segments patent bilaterally. Scattered atheromatous irregularity throughout the cavernous/supraclinoid ICAs with resultant mild to moderate multifocal narrowing (no more than 50%). 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 There is a second aneurysm arising from the distal cavernous segment measuring 5 x 4 x 4 mm (series 13, image 109). This is directed medially and slightly posteriorly. A third aneurysm seen just distally arising from the supraclinoid left ICA measures 3 mm, and is directed anteriorly and slightly laterally (series 13, image 105). On the right, 4 mm focal outpouching arising from the proximal cavernous right ICA suspicious for a small aneurysm. This is directed laterally (series 13, image 118). There is a second aneurysm arising from the supraclinoid right ICA distally that  measures 4 x 4 x 3 mm (series 13, image 109). This is directed inferiorly and slightly posteriorly. ICA termini patent bilaterally. Right A1 widely patent. Left A1 hypoplastic with a moderate to severe short-segment stenosis at its proximal aspect (series 14, image 85). Anterior communicating artery complex irregular without definite discrete aneurysm. Anterior cerebral arteries irregular but patent distally without high-grade stenosis. M1 segments mildly irregular but patent bilaterally without flow-limiting stenosis. Normal MCA bifurcations. Diffuse small vessel  atheromatous irregularity seen throughout the MCA branches bilaterally. No vascular abnormality seen underlying the large right cerebral hemorrhage. Posterior circulation: Vertebral arteries demonstrate diffuse atheromatous irregularity but are patent to the vertebrobasilar junction without high-grade stenosis. Posterior inferior cerebral arteries patent bilaterally. Basilar diminutive and irregular but patent to its distal aspect without high-grade stenosis. Superior cerebral arteries patent bilaterally. There is a probable small aneurysm arising from the basilar tip measuring approximately 3 mm (series 13, image 113). Fetal type origin of the left PCA supplied via the left posterior communicating artery. Right PCA supplied via a small right P1 segment as well as the right posterior communicating artery. PCAs grossly patent to their distal aspects without high-grade stenosis. Venous sinuses: Not well assessed due to timing of the contrast bolus. Anatomic variants: None significant. Delayed phase: No abnormal enhancement. IMPRESSION: CT HEAD IMPRESSION 1. No significant interval change in size of large intraparenchymal hematoma centered at the right basal ganglia, measuring 8.4 x 4.1 x 4.4 cm. Slightly increased localized vasogenic edema with similar regional mass effect and 10 mm of right-to-left midline shift. 2. Associated intraventricular extension with blood primarily in the lateral and third ventricles, similar to previous. Stable ventricular size and morphology without worsening hydrocephalus or ventricular trapping. 3. No other new acute intracranial abnormality. CTA HEAD IMPRESSION 1. No vascular abnormality seen underlying the large right cerebral hemorrhage. 2. 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 3. Underlying moderate to advanced intracranial atherosclerotic change. No proximal high-grade or correctable stenosis identified. Electronically Signed   By: Jeannine Boga M.D.   On: 02/28/2018 05:52   Dg Chest Port 1 View  Result Date: 03/01/2018 CLINICAL DATA:  Acute respiratory failure. EXAM: PORTABLE CHEST 1 VIEW COMPARISON:  02/28/2018. FINDINGS: Endotracheal tube and NG tube in stable position. Right PICC line stable position with tip over upper right atrium. Stable cardiomegaly. Progressive atelectasis/infiltrate right lung base. New small right pleural effusion. No pneumothorax. IMPRESSION: 1.  Lines and tubes in unchanged position. 2. Progressive right base atelectasis/infiltrate. New small right pleural effusion. Electronically Signed   By: Marcello Moores  Register   On: 03/01/2018 06:37   Dg Chest Port 1 View  Result Date: 02/28/2018 CLINICAL DATA:  Endotracheal tube and seizures EXAM: PORTABLE CHEST 1 VIEW COMPARISON:  03/03/2018 FINDINGS: Right PICC placed. Tip is at the cavoatrial junction. Endotracheal and NG tubes are stable. Upper normal heart size allowing for AP technique. Lungs are under aerated with worsening bibasilar atelectasis. IMPRESSION: Right PICC placed.  Tip is at the cavoatrial junction. Worsening bibasilar atelectasis. Electronically Signed   By: Marybelle Killings M.D.   On: 02/28/2018 07:48   CT head: 1. Acute hemorrhage centered in right basal ganglia with extension into right cerebral hemisphere measuring up to 8.4 cm, 79 CC. 2. Extension of hemorrhage into the intraventricular system, greatest in the  right lateral ventricle. 3. Mass effect with 10 mm right-to-left midline shift, right-sided uncal herniation, and partial effacement of basilar cisterns. No downward herniation at this time.  CT cervical spine. 1. No acute fracture or dislocation of the cervical spine. 2. 15 mm nodule within left lobe of thyroid gland. Further evaluation with  thyroid ultrasound is recommended on a nonemergent basis. 3. Mild spondylosis of the cervical spine.  CT brain repeat 02/27/18 1. Similar to slightly decreased size of large intraparenchymal hematoma centered at the right basal ganglia, estimated volume 71.9 cc on current exam, previously 75 cc. 2. Similar regional mass effect with up to 10 mm of right-to-left midline shift. 3. Associated intraventricular extension with blood primarily in the lateral and third ventricles, similar to previous. 4. No other new intracranial abnormality.  Assessment: 53 y.o. female with acute right basal ganglia hemorrhage etiology likely hypertensive 1. The hemorrhage measures 75 cc=-slightly improved There is extension into the right cerebral hemisphere and ventricles as well as mass effect with 10 mm right to left midline shift, right sided uncal herniation and partial effacement of the basilar cisterns.        Plan: continue hypertonic saline with serum sodium goal 150-155. Strict blood pressure control with SBP goal below 160 Maintain normothermia, euglycemia and euvolemic.Marland KitchenRecommend repeat CT scan of the head tomorrow morning and if stable consider extubation if tolerated. Long d/w sister at bedside and answered questions about prognosis. D/w  [place]orn  This patient is critically ill and at significant risk of neurological worsening, death and care requires constant monitoring of vital signs, hemodynamics,respiratory and cardiac monitoring, extensive review of multiple databases, frequent neurological assessment, discussion with family, other specialists and medical decision making of high complexity.I have made any additions or clarifications directly to the above note.This critical care time does not reflect procedure time, or teaching time or supervisory time of PA/NP/Med Resident etc but could involve care discussion time.  I spent 35 minutes of neurocritical care time  in the care of  this patient.      Antony Contras, MD Medical Director [place] Pager: [phone] 03/01/2018 3:41 PM  03/01/2018, 3:41 PM

## 2018-03-02 ENCOUNTER — Inpatient Hospital Stay (HOSPITAL_COMMUNITY): Payer: Managed Care, Other (non HMO)

## 2018-03-02 DIAGNOSIS — J690 Pneumonitis due to inhalation of food and vomit: Secondary | ICD-10-CM

## 2018-03-02 LAB — BASIC METABOLIC PANEL
Anion gap: 10 (ref 5–15)
BUN: 16 mg/dL (ref 6–20)
CO2: 25 mmol/L (ref 22–32)
Calcium: 9.3 mg/dL (ref 8.9–10.3)
Chloride: 114 mmol/L — ABNORMAL HIGH (ref 98–111)
Creatinine, Ser: 0.82 mg/dL (ref 0.44–1.00)
GFR calc Af Amer: >60 mL/min (ref >60)
GFR calc non Af Amer: >60 mL/min (ref >60)
Glucose, Bld: 131 mg/dL — ABNORMAL HIGH (ref 70–99)
Potassium: 3.3 mmol/L — ABNORMAL LOW (ref 3.5–5.1)
Sodium: 149 mmol/L — ABNORMAL HIGH (ref 135–145)

## 2018-03-02 LAB — GLUCOSE, CAPILLARY
GLUCOSE-CAPILLARY: 134 mg/dL — AB (ref 70–99)
GLUCOSE-CAPILLARY: 98 mg/dL (ref 70–99)
Glucose-Capillary: 102 mg/dL — ABNORMAL HIGH (ref 70–99)
Glucose-Capillary: 118 mg/dL — ABNORMAL HIGH (ref 70–99)
Glucose-Capillary: 124 mg/dL — ABNORMAL HIGH (ref 70–99)
Glucose-Capillary: 188 mg/dL — ABNORMAL HIGH (ref 70–99)

## 2018-03-02 LAB — PHOSPHORUS: Phosphorus: 2 mg/dL — ABNORMAL LOW (ref 2.5–4.6)

## 2018-03-02 LAB — SODIUM
SODIUM: 150 mmol/L — AB (ref 135–145)
SODIUM: 150 mmol/L — AB (ref 135–145)
Sodium: 151 mmol/L — ABNORMAL HIGH (ref 135–145)

## 2018-03-02 LAB — MAGNESIUM: Magnesium: 2.2 mg/dL (ref 1.7–2.4)

## 2018-03-02 MED ORDER — FENTANYL BOLUS VIA INFUSION
50.0000 ug | INTRAVENOUS | Status: DC | PRN
  Administered 2018-03-03 (×2): 50 ug via INTRAVENOUS
  Filled 2018-03-02: qty 100

## 2018-03-02 MED ORDER — VANCOMYCIN HCL 10 G IV SOLR: 1250.0000 mg | Freq: Once | INTRAVENOUS | Status: DC

## 2018-03-02 MED ORDER — AMLODIPINE BESYLATE 10 MG PO TABS
10.0000 mg | ORAL_TABLET | Freq: Every day | ORAL | Status: DC
  Administered 2018-03-02 – 2018-03-07 (×6): 10 mg
  Filled 2018-03-02 (×6): qty 1

## 2018-03-02 MED ORDER — VANCOMYCIN HCL 10 G IV SOLR
1250.0000 mg | Freq: Two times a day (BID) | INTRAVENOUS | Status: DC
  Administered 2018-03-03: 1250 mg via INTRAVENOUS
  Filled 2018-03-02 (×2): qty 1250

## 2018-03-02 MED ORDER — VANCOMYCIN HCL 10 G IV SOLR
2000.0000 mg | Freq: Once | INTRAVENOUS | Status: AC
  Administered 2018-03-02: 2000 mg via INTRAVENOUS
  Filled 2018-03-02: qty 2000

## 2018-03-02 NOTE — Procedures (Signed)
Cortrak  Person Inserting Tube:  Esaw Dace, RD Tube Type:  Cortrak - 43 inches Tube Location:  Right nare Initial Placement:  Stomach Secured by: Bridle Technique Used to Measure Tube Placement:  Documented cm marking at nare/ corner of mouth Cortrak Secured At:  59 cm Procedure Comments:  Cortrak Tube Team Note:  Consult received to place a Cortrak feeding tube.   No x-ray is required. RN may begin using tube.   If the tube becomes dislodged please keep the tube and contact the Cortrak team at [email or web address] (password [email or web address]) for replacement.  If after hours and replacement cannot be delayed, place a NG tube and confirm placement with an abdominal x-ray.   Kerman Passey MS, RD, [place], [place] [phone] Pager  [phone] Weekend/On-Call Pager

## 2018-03-02 NOTE — Progress Notes (Signed)
STROKE TEAM PROGRESS NOTE   SUBJECTIVE :    her sister, is at the bedside. She remains on sedation.  She  is on hypertonic saline and sodium level is 149 this am  She is less arousable today and appears to have low-grade fever. Respiratory cultures are growing staph aureus and sensitivities are pending.CT scan of the head shows slight shrinkage in hematoma size but persistent 10 mm midline shift  Past Medical History:  Diagnosis Date  . Anxiety   . Depression   . Hypertension   . Migraines   . Seizures ([place])    last seizure 3 years ago per pt    Past Surgical History:  Procedure Laterality Date  . EXAM UNDER ANESTHESIA WITH MANIPULATION OF SHOULDER    . INCISION AND DRAINAGE     left lower side- staph infection  . STAPEDES SURGERY      Family History  Problem Relation Age of Onset  . Diabetes Mother   . Non-Hodgkin's lymphoma Sister   . Rectal cancer Maternal Grandfather   . Hypertension Maternal Grandfather   . Diabetes Maternal Grandfather   . Colon cancer Neg Hx    Social History:  reports that she has quit smoking. She has never used smokeless tobacco. She reports current alcohol use of about 14.0 standard drinks of alcohol per week. She reports that she does not use drugs.  Allergies:  Allergies  Allergen Reactions  . Aspirin Other (See Comments) and Nausea And Vomiting  . Lovastatin Diarrhea  . Simvastatin Diarrhea  . Statins Other (See Comments)    Diarrhea   . Tetracyclines & Related Nausea Only    Home Medications   ROS: Unable to obtain due to sedation  Physical Examination: Blood pressure (!) 127/55, pulse 73, temperature 99.9 F (37.7 C), temperature source Axillary, resp. rate (!) 21, height 5\' 2"  (1.575 m), weight 89.5 kg, SpO2 97 %.  HEENT-  [place]/AT  Lungs - Intubated on ventilator Extremities - Warm and well perfused  Neurologic Examination: Mental Status: Intubated and sedated  Barely opens eyes to sternal rub. Will not follow commands  consistently.Marland Kitchen No eye opening spontaneously or to command. Flails RUE and RLE to sternal rub. Trace withdrawal on the left upper extremity and lower extremity. Cranial Nerves: II:  Pupils 56mm bilaterally and unreactive (sedated)- left slugglish. No blink to threat.  Fundi not visualized III,IV, VI: No doll's eye reflex (sedated). Positive corneal reflexes  VII: Face flaccidly symmetric.  VIII: No response to voice IX,X: Intubated XI: Unable to assess XII: positive gag with introduction of inline cath. Mildly delayed Motor/Sensory: RUE: Flails nonpurposefully to noxious stimuli.  RLE: Withdraws to noxious and flails nonpurposefully intermittently LUE: Increased extensor tone.no movement with noxious stimuli LLE: Increased extensor tone. Intermittent Withdrawal to painMuscle bulk is normal x 4  Cerebellar/Gait: Unable to assess   Results for orders placed or performed during the hospital encounter of 03/16/2018 (from the past 48 hour(s))  Glucose, capillary     Status: Abnormal   Collection Time: 02/28/18  4:02 PM  Result Value Ref Range   Glucose-Capillary 129 (H) 70 - 99 mg/dL   Comment 1 Notify RN    Comment 2 Document in Chart   Sodium     Status: Abnormal   Collection Time: 02/28/18  4:30 PM  Result Value Ref Range   Sodium 169 (HH) 135 - 145 mmol/L    Comment: CRITICAL RESULT CALLED TO, READ BACK BY AND VERIFIED WITH: E REEN,RN  1719 02/28/2017 WBOND 02/28/2018 Performed at [facility] [address], [place], [place] [address] CORRECTED ON 01/06 AT 2310: PREVIOUSLY REPORTED AS 169 CRITICAL RESULT CALLED TO, READ BACK BY AND VERIFIED WITH: E REEN,RN 1719 02/28/2017 WBOND   Magnesium     Status: None   Collection Time: 02/28/18  5:26 PM  Result Value Ref Range   Magnesium 2.0 1.7 - 2.4 mg/dL    Comment: Performed at [facility], [place] [address] [place], [place] [address]  Phosphorus     Status: Abnormal   Collection Time: 02/28/18  5:26 PM  Result Value Ref  Range   Phosphorus 1.5 (L) 2.5 - 4.6 mg/dL    Comment: Performed at [facility] [address], [place], [place] [address]  Glucose, capillary     Status: Abnormal   Collection Time: 02/28/18  8:04 PM  Result Value Ref Range   Glucose-Capillary 118 (H) 70 - 99 mg/dL  Glucose, capillary     Status: Abnormal   Collection Time: 02/28/18 11:34 PM  Result Value Ref Range   Glucose-Capillary 126 (H) 70 - 99 mg/dL  Sodium     Status: Abnormal   Collection Time: 03/01/18  1:14 AM  Result Value Ref Range   Sodium 151 (H) 135 - 145 mmol/L    Comment: Performed at [facility], [place] [address] [place], [place] [address]  Glucose, capillary     Status: Abnormal   Collection Time: 03/01/18  3:26 AM  Result Value Ref Range   Glucose-Capillary 149 (H) 70 - 99 mg/dL  Magnesium     Status: None   Collection Time: 03/01/18  5:11 AM  Result Value Ref Range   Magnesium 2.0 1.7 - 2.4 mg/dL    Comment: Performed at [facility], [place] [address], [place], [place] [address]  Phosphorus     Status: Abnormal   Collection Time: 03/01/18  5:11 AM  Result Value Ref Range   Phosphorus 2.0 (L) 2.5 - 4.6 mg/dL    Comment: Performed at [facility] [address], [place], [place] [address]  CBC     Status: Abnormal   Collection Time: 03/01/18  5:11 AM  Result Value Ref Range   WBC 12.9 (H) 4.0 - 10.5 K/uL   RBC 3.83 (L) 3.87 - 5.11 MIL/uL   Hemoglobin 11.6 (L) 12.0 - 15.0 g/dL   HCT 36.4 36.0 - 46.0 %   MCV 95.0 80.0 - 100.0 fL   MCH 30.3 26.0 - 34.0 pg   MCHC 31.9 30.0 - 36.0 g/dL   RDW 13.8 11.5 - 15.5 %   Platelets 212 150 - 400 K/uL   nRBC 0.0 0.0 - 0.2 %    Comment: Performed at [facility], [place] [address], [place], [place] [address]  Comprehensive metabolic panel     Status: Abnormal   Collection Time: 03/01/18  5:11 AM  Result Value Ref Range   Sodium 152 (H) 135 - 145 mmol/L   Potassium 2.5 (LL) 3.5 - 5.1 mmol/L    Comment: CRITICAL RESULT CALLED TO, READ  BACK BY AND VERIFIED WITH: EVERETTE D,RN 03/01/18 0622 WAYK    Chloride 119 (H) 98 - 111 mmol/L   CO2 26 22 - 32 mmol/L   Glucose, Bld 142 (H) 70 - 99 mg/dL   BUN 16 6 - 20 mg/dL   Creatinine, Ser 0.92 0.44 - 1.00 mg/dL   Calcium 8.6 (L)  8.9 - 10.3 mg/dL   Total Protein 6.1 (L) 6.5 - 8.1 g/dL   Albumin 3.0 (L) 3.5 - 5.0 g/dL   AST 59 (H) 15 - 41 U/L   ALT 49 (H) 0 - 44 U/L   Alkaline Phosphatase 67 38 - 126 U/L   Total Bilirubin 0.4 0.3 - 1.2 mg/dL   GFR calc non Af Amer >60 >60 mL/min   GFR calc Af Amer >60 >60 mL/min   Anion gap 7 5 - 15    Comment: Performed at [facility] [address], [place], [place] [address]  Glucose, capillary     Status: Abnormal   Collection Time: 03/01/18  8:06 AM  Result Value Ref Range   Glucose-Capillary 132 (H) 70 - 99 mg/dL   Comment 1 Notify RN    Comment 2 Document in Chart   Culture, respiratory (non-expectorated)     Status: None (Preliminary result)   Collection Time: 03/01/18 11:23 AM  Result Value Ref Range   Specimen Description TRACHEAL ASPIRATE    Special Requests NONE    Gram Stain      MODERATE WBC PRESENT, PREDOMINANTLY PMN MODERATE GRAM POSITIVE COCCI Performed at [facility], [place] [address] [place], [place] [address]    Culture ABUNDANT STAPHYLOCOCCUS AUREUS    Report Status PENDING   Glucose, capillary     Status: Abnormal   Collection Time: 03/01/18 12:00 PM  Result Value Ref Range   Glucose-Capillary 109 (H) 70 - 99 mg/dL   Comment 1 Notify RN    Comment 2 Document in Chart   Sodium     Status: Abnormal   Collection Time: 03/01/18  2:12 PM  Result Value Ref Range   Sodium 152 (H) 135 - 145 mmol/L    Comment: Performed at [facility] [address], [place], [place] [address]  Glucose, capillary     Status: Abnormal   Collection Time: 03/01/18  3:05 PM  Result Value Ref Range   Glucose-Capillary 149 (H) 70 - 99 mg/dL   Comment 1 Notify RN    Comment 2 Document in Chart   Magnesium      Status: None   Collection Time: 03/01/18  7:45 PM  Result Value Ref Range   Magnesium 2.2 1.7 - 2.4 mg/dL    Comment: Performed at [facility], [place] [address] [place], [place] [address]  Phosphorus     Status: Abnormal   Collection Time: 03/01/18  7:45 PM  Result Value Ref Range   Phosphorus 2.0 (L) 2.5 - 4.6 mg/dL    Comment: Performed at [facility] [address], [place], [place] [address]  BMET today at 2000     Status: Abnormal   Collection Time: 03/01/18  7:45 PM  Result Value Ref Range   Sodium 154 (H) 135 - 145 mmol/L   Potassium 3.5 3.5 - 5.1 mmol/L   Chloride 120 (H) 98 - 111 mmol/L   CO2 25 22 - 32 mmol/L   Glucose, Bld 118 (H) 70 - 99 mg/dL   BUN 19 6 - 20 mg/dL   Creatinine, Ser 0.83 0.44 - 1.00 mg/dL   Calcium 9.2 8.9 - 10.3 mg/dL   GFR calc non Af Amer >60 >60 mL/min   GFR calc Af Amer >60 >60 mL/min   Anion gap 9 5 - 15    Comment: Performed at [facility], [place] [address], [place],  [address]  Glucose, capillary     Status: Abnormal   Collection Time: 03/01/18  7:59 PM  Result Value Ref Range   Glucose-Capillary 101 (H) 70 - 99 mg/dL  Glucose, capillary     Status: None   Collection Time: 03/01/18 11:41 PM  Result Value Ref Range   Glucose-Capillary 91 70 - 99 mg/dL  Sodium     Status: Abnormal   Collection Time: 03/02/18  3:20 AM  Result Value Ref Range   Sodium 151 (H) 135 - 145 mmol/L    Comment: Performed at [facility], [place] [address], [place], [place] [address]  Glucose, capillary     Status: Abnormal   Collection Time: 03/02/18  3:48 AM  Result Value Ref Range   Glucose-Capillary 134 (H) 70 - 99 mg/dL  Glucose, capillary     Status: Abnormal   Collection Time: 03/02/18  7:53 AM  Result Value Ref Range   Glucose-Capillary 118 (H) 70 - 99 mg/dL   Comment 1 Notify RN    Comment 2 Document in Chart   Sodium     Status: Abnormal   Collection Time: 03/02/18  9:04 AM  Result Value Ref Range   Sodium 150 (H) 135 -  145 mmol/L    Comment: Performed at [facility], [place] [address], [place], [place] [address]  Basic metabolic panel     Status: Abnormal   Collection Time: 03/02/18  9:36 AM  Result Value Ref Range   Sodium 149 (H) 135 - 145 mmol/L   Potassium 3.3 (L) 3.5 - 5.1 mmol/L   Chloride 114 (H) 98 - 111 mmol/L   CO2 25 22 - 32 mmol/L   Glucose, Bld 131 (H) 70 - 99 mg/dL   BUN 16 6 - 20 mg/dL   Creatinine, Ser 0.82 0.44 - 1.00 mg/dL   Calcium 9.3 8.9 - 10.3 mg/dL   GFR calc non Af Amer >60 >60 mL/min   GFR calc Af Amer >60 >60 mL/min   Anion gap 10 5 - 15    Comment: Performed at [facility], [place] [address] [place], [place] [address]  Glucose, capillary     Status: Abnormal   Collection Time: 03/02/18 11:49 AM  Result Value Ref Range   Glucose-Capillary 188 (H) 70 - 99 mg/dL   Comment 1 Notify RN    Comment 2 Document in Chart    Ct Head Wo Contrast  Result Date: 03/02/2018 CLINICAL DATA:  Follow-up examination for intracranial hemorrhage. EXAM: CT HEAD WITHOUT CONTRAST TECHNIQUE: Contiguous axial images were obtained from the base of the skull through the vertex without intravenous contrast. COMPARISON:  Prior CT from 02/28/2018 FINDINGS: Brain: Large intraparenchymal hemorrhage centered at the right basal ganglia measures 7.9 x 3.9 x 4.5 cm on today's study, slightly decreased from previous. Surrounding low-density vasogenic edema with regional mass effect relatively similar there remains up to approximately 10 mm right-to-left shift, unchanged. Intraventricular extension with blood in the lateral and third ventricles, not significantly changed in volume. Stable ventricular size and morphology without worsening hydrocephalus or ventricular trapping. No other new acute intracranial hemorrhage. No acute large vessel territory infarct. No extra-axial collection. Vascular: No hyperdense vessel. Skull: Scalp soft tissues and calvarium demonstrate no acute finding. Sinuses/Orbits: Globes  and orbital soft tissues within normal limits. Moderate mucosal thickening throughout the paranasal sinuses with associated air-fluid levels. Fluid within the nasopharynx. Patient likely intubated. Small bilateral mastoid effusions noted. Other: None. IMPRESSION: 1. Slight interval decrease  in size of large intraparenchymal hemorrhage centered at the right basal ganglia, now measuring 7.9 x 3.9 x 4.5 cm on today's study. 2. Similar regional mass effect with up to 10 mm right-to-left shift. 3. Similar intraventricular extension with blood in the lateral and third ventricles. Overall ventricular size and morphology relatively stable without worsening hydrocephalus or ventricular trapping. 4. No other new acute intracranial abnormality. Electronically Signed   By: Jeannine Boga M.D.   On: 03/02/2018 05:10   Dg Chest Port 1 View  Result Date: 03/01/2018 CLINICAL DATA:  Acute respiratory failure. EXAM: PORTABLE CHEST 1 VIEW COMPARISON:  02/28/2018. FINDINGS: Endotracheal tube and NG tube in stable position. Right PICC line stable position with tip over upper right atrium. Stable cardiomegaly. Progressive atelectasis/infiltrate right lung base. New small right pleural effusion. No pneumothorax. IMPRESSION: 1.  Lines and tubes in unchanged position. 2. Progressive right base atelectasis/infiltrate. New small right pleural effusion. Electronically Signed   By: Marcello Moores  Register   On: 03/01/2018 06:37   CT head: 1. Acute hemorrhage centered in right basal ganglia with extension into right cerebral hemisphere measuring up to 8.4 cm, 79 CC. 2. Extension of hemorrhage into the intraventricular system, greatest in the right lateral ventricle. 3. Mass effect with 10 mm right-to-left midline shift, right-sided uncal herniation, and partial effacement of basilar cisterns. No downward herniation at this time.  CT cervical spine. 1. No acute fracture or dislocation of the cervical spine. 2. 15 mm nodule  within left lobe of thyroid gland. Further evaluation with thyroid ultrasound is recommended on a nonemergent basis. 3. Mild spondylosis of the cervical spine.  CT brain repeat 02/27/18 1. Similar to slightly decreased size of large intraparenchymal hematoma centered at the right basal ganglia, estimated volume 71.9 cc on current exam, previously 75 cc. 2. Similar regional mass effect with up to 10 mm of right-to-left midline shift. 3. Associated intraventricular extension with blood primarily in the lateral and third ventricles, similar to previous. 4. No other new intracranial abnormality.  Assessment: 53 y.o. female with acute right basal ganglia hemorrhage etiology likely hypertensive 1. The hemorrhage measures 75 cc=-slightly improved There is extension into the right cerebral hemisphere and ventricles as well as mass effect with 10 mm right to left midline shift, right sided uncal herniation and partial effacement of the basilar cisterns.        Plan: restart hypertonic saline with serum sodium goal 150-155. Strict blood pressure control with SBP goal below 160 Maintain normothermia, euglycemia and euvolemic.Marland KitchenMarland KitchenStart vancomycin Long d/w sister at bedside and answered questions about prognosis. D/w  [place] This patient is critically ill and at significant risk of neurological worsening, death and care requires constant monitoring of vital signs, hemodynamics,respiratory and cardiac monitoring, extensive review of multiple databases, frequent neurological assessment, discussion with family, other specialists and medical decision making of high complexity.I have made any additions or clarifications directly to the above note.This critical care time does not reflect procedure time, or teaching time or supervisory time of PA/NP/Med Resident etc but could involve care discussion time.  I spent 35 minutes of neurocritical care time  in the care of  this patient.     Antony Contras, MD Medical  Director [facility] Pager: [phone] 03/02/2018 1:52 PM  03/02/2018, 1:52 PM

## 2018-03-02 NOTE — Progress Notes (Addendum)
NAME:  CORLISS COGGESHALL, MRN:  [id number], DOB:  [birth year], LOS: 4 ADMISSION DATE:  03/18/2018, CONSULTATION DATE:  03/24/2018 REFERRING MD:  Kerney Elbe, CHIEF COMPLAINT:  Found down   Brief History   53F found down, 8 cm basal ganglia hemorrhage. Intubated in ED   Past Medical History  Alcohol abuse, Hypertension & Seizures  Significant Hospital Events   1/04 Admit with AMS 1/6 remains on Cleviprex. Agitated at times. No sig change  1/7: Weaning.  Neuro exam the same.  Blood pressure controlled.  Sputum sent for right lower lobe infiltrate and mild leukocytosis 1/8: CT head stable.  Growing staph in sputum empirically starting antibiotics.  Working on Careers adviser. 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  No significant change in large intraparenchymal hematoma in the right basal ganglia slight increase in localized vasogenic edema ongoing mass-effect right to left shift CT brain 1/7:no sig change  Micro Data:   Respiratory culture 1/7: SA>>> Antimicrobials:  vanc 1/8 >>>   Subjective:  Working on weaning  Objective   Blood pressure (Abnormal) 154/62, pulse 74, temperature 99.3 F (37.4 C), temperature source Axillary, resp. rate 19, height 5\' 2"  (1.575 m), weight 89.5 kg, SpO2 97 %.    Vent Mode: CPAP;PSV FiO2 (%):  [40 %] 40 % Set Rate:  [18 bmp] 18 bmp Vt Set:  [450 mL] 450 mL PEEP:  [5 cmH20] 5 cmH20 Pressure Support:  [5 cmH20-8 cmH20] 5 cmH20 Plateau Pressure:  [16 cmH20-18 cmH20] 18 cmH20   Intake/Output Summary (Last 24 hours) at 03/02/2018 0917 Last data filed at 03/02/2018 0700 Gross per 24 hour  Intake 1199.29 ml  Output 1050 ml  Net 149.29 ml   Filed Weights     03/04/2018 2300 03/01/18 0500 03/02/18 0500  Weight: 91.2 kg 89.3 kg 89.5 kg    Examination: General: This is a 53 year old white female she is resting currently on the ventilator no acute distress. HEENT normocephalic atraumatic.  Orally intubated Pulmonary: Scattered rhonchi no accessory use excellent tidal volume on pressure support ventilation Cardiac: Regular rate and rhythm without murmur rub or gallop Abdomen: Soft nontender no organomegaly Extremities: No edema brisk cap refill strong pulses Neuro: Awake, agitated at times.  Intermittently will follow commands.  Left-sided hemiparesis persists.  Intermittent agitation.    ag metabolic acidosis Renal failure (AKI resolved 1/5) rhabdo  Leukocytosis  Assessment & Plan:   53 year old female history of alcohol abuse, hypertension, and seizures who was brought in after being found down with unknown last normal. Found to have 8.4 cm right basal ganglia hemorrhage.  Right basal ganglia hemorrhage Malignant hypertension -family declined surgery  P: Sodium goal 7/54/4235 Systolic blood pressure goal less than 140 (adjusting Norvasc in effort to get off cleviprex) Serial neuro checks Repeat CT head 1/8   Acute Respiratory Insufficiency in setting of ICH   -intubation for airway protection Portable chest x-ray personally reviewed: p: Daily spontaneous breathing trial PAD goal for RASS of 0 N.p.o. VAP bundle Place nasogastric tube, doubt she will tolerate oral   RLL Staph pneumonia  Follow-up sputum cultures P: Start IV vanc (Narrow to Naf if not MRSA)  Fluid and electrolyte imbalance: Hypernatremia, Hyperkalemia  (therapeutic)->protocol stopped 1/6  after Na was > 160 P: Serial sodium checks Repeat chemistry today  Alcohol abuse P: Continuing thiamine and folate Ativan changed to PRN  Anemia  -no evidence of bleeding currently  P:  Trend CBC  Best practice:  Diet: NPO Pain/Anxiety/Delirium protocol (if  indicated): fentanyl VAP protocol (if indicated): ordered DVT prophylaxis: contraindicated with bleed, scd's GI prophylaxis: PPI Glucose control: sliding scale insulin Mobility: contraindicated at this time Code Status: full code Family Communication: No family at bedside on NP rounds 1/5.  Will update on arrival.   Disposition: ICU under neurology care; work on weaning. Hope to extubate soon.    Critical care time:  31 minutes     Erick Colace ACNP-BC [place] Pager # [phone] OR # [phone] if no answer

## 2018-03-02 NOTE — Progress Notes (Signed)
RT NOTE: patient transported to CT in servo I at this time with no issues BMV carried suctioned before and after

## 2018-03-02 NOTE — Progress Notes (Signed)
Pharmacy Antibiotic Note  Jacqueline Pratt is a 53 y.o. female admitted on 02/24/2018 with basal ganglia hemorrhage. Now with R lower lung pneumonia. Respiratory cultures are growing staph aureus, sensitivities have not resulted yet.  Vancomycin 1250 mg IV Q 12 hrs. Goal AUC 400-550. Expected AUC: 520 SCr used: 0.8   Plan: -Vancomycin 1250 mg IV q12h -Monitor renal fx, check levels as needed -F/u culture + sensitivities   Height: 5\' 2"  (157.5 cm) Weight: 197 lb 5 oz (89.5 kg) IBW/kg (Calculated) : 50.1  Temp (24hrs), Avg:99 F (37.2 C), Min:98.4 F (36.9 C), Max:99.5 F (37.5 C)  Recent Labs  Lab 03/23/2018 1716 03/14/2018 2120 02/27/18 0000 02/27/18 2146 02/28/18 0340 02/28/18 0530 03/01/18 0511 03/01/18 1945  WBC 25.5*  --   --   --   --  8.4 12.9*  --   CREATININE 1.18*  0.90  --  1.14* 0.88 0.90  --  0.92 0.83  LATICACIDVEN  --  4.2* 3.9*  --   --   --   --   --     Estimated Creatinine Clearance: 81.5 mL/min (by C-G formula based on SCr of 0.83 mg/dL).    Allergies  Allergen Reactions  . Aspirin Other (See Comments) and Nausea And Vomiting  . Lovastatin Diarrhea  . Simvastatin Diarrhea  . Statins Other (See Comments)    Diarrhea   . Tetracyclines & Related Nausea Only    Antimicrobials this admission: 1/8 vancomycin >  Dose adjustments this admission: N/A  Microbiology results: 1/7 resp cx: staph aureus   Harvel Quale 03/02/2018 9:51 AM

## 2018-03-03 ENCOUNTER — Inpatient Hospital Stay (HOSPITAL_COMMUNITY): Payer: Managed Care, Other (non HMO)

## 2018-03-03 LAB — GLUCOSE, CAPILLARY
GLUCOSE-CAPILLARY: 148 mg/dL — AB (ref 70–99)
Glucose-Capillary: 127 mg/dL — ABNORMAL HIGH (ref 70–99)
Glucose-Capillary: 152 mg/dL — ABNORMAL HIGH (ref 70–99)
Glucose-Capillary: 150 mg/dL — ABNORMAL HIGH (ref 70–99)
Glucose-Capillary: 103 mg/dL — ABNORMAL HIGH (ref 70–99)
Glucose-Capillary: 142 mg/dL — ABNORMAL HIGH (ref 70–99)

## 2018-03-03 LAB — BASIC METABOLIC PANEL
Anion gap: 10 (ref 5–15)
Anion gap: 7 (ref 5–15)
BUN: 17 mg/dL (ref 6–20)
BUN: 18 mg/dL (ref 6–20)
CO2: 23 mmol/L (ref 22–32)
CO2: 27 mmol/L (ref 22–32)
CREATININE: 0.77 mg/dL (ref 0.44–1.00)
Calcium: 8.5 mg/dL — ABNORMAL LOW (ref 8.9–10.3)
Calcium: 8.6 mg/dL — ABNORMAL LOW (ref 8.9–10.3)
Chloride: 119 mmol/L — ABNORMAL HIGH (ref 98–111)
Chloride: 121 mmol/L — ABNORMAL HIGH (ref 98–111)
Creatinine, Ser: 0.75 mg/dL (ref 0.44–1.00)
GFR calc Af Amer: >60 mL/min (ref >60)
GFR calc non Af Amer: >60 mL/min (ref >60)
GFR calc non Af Amer: >60 mL/min (ref >60)
Glucose, Bld: 131 mg/dL — ABNORMAL HIGH (ref 70–99)
Glucose, Bld: 140 mg/dL — ABNORMAL HIGH (ref 70–99)
Potassium: 2.8 mmol/L — ABNORMAL LOW (ref 3.5–5.1)
Potassium: 2.6 mmol/L — CL (ref 3.5–5.1)
Sodium: 152 mmol/L — ABNORMAL HIGH (ref 135–145)
Sodium: 155 mmol/L — ABNORMAL HIGH (ref 135–145)

## 2018-03-03 LAB — CBC
HCT: 39.4 % (ref 36.0–46.0)
Hemoglobin: 12 g/dL (ref 12.0–15.0)
MCH: 29.1 pg (ref 26.0–34.0)
MCHC: 30.5 g/dL (ref 30.0–36.0)
MCV: 95.6 fL (ref 80.0–100.0)
Platelets: 227 10*3/uL (ref 150–400)
RBC: 4.12 MIL/uL (ref 3.87–5.11)
RDW: 13.4 % (ref 11.5–15.5)
WBC: 12.5 10*3/uL — ABNORMAL HIGH (ref 4.0–10.5)
nRBC: 0 % (ref 0.0–0.2)

## 2018-03-03 LAB — CULTURE, RESPIRATORY

## 2018-03-03 LAB — PROCALCITONIN: Procalcitonin: <0.1 ng/mL

## 2018-03-03 LAB — SODIUM: Sodium: 151 mmol/L — ABNORMAL HIGH (ref 135–145)

## 2018-03-03 LAB — CULTURE, RESPIRATORY W GRAM STAIN

## 2018-03-03 MED ORDER — POTASSIUM CHLORIDE 10 MEQ/100ML IV SOLN
10.0000 meq | INTRAVENOUS | Status: AC
  Administered 2018-03-03 – 2018-03-04 (×10): 10 meq via INTRAVENOUS
  Filled 2018-03-03 (×9): qty 100

## 2018-03-03 MED ORDER — POTASSIUM PHOSPHATES 15 MMOLE/5ML IV SOLN: 20.0000 mmol | Freq: Once | INTRAVENOUS | Status: DC

## 2018-03-03 MED ORDER — VALPROIC ACID 250 MG/5ML PO SOLN
500.0000 mg | Freq: Three times a day (TID) | ORAL | Status: DC
  Administered 2018-03-03: 500 mg
  Filled 2018-03-03 (×2): qty 10

## 2018-03-03 MED ORDER — ALPRAZOLAM 0.25 MG PO TABS
0.2500 mg | ORAL_TABLET | Freq: Two times a day (BID) | ORAL | Status: DC
  Administered 2018-03-03 – 2018-03-05 (×5): 0.25 mg
  Filled 2018-03-03 (×5): qty 1

## 2018-03-03 MED ORDER — SODIUM CHLORIDE 0.9 % IV SOLN
2.0000 g | Freq: Four times a day (QID) | INTRAVENOUS | Status: DC
  Administered 2018-03-03 – 2018-03-08 (×20): 2 g via INTRAVENOUS
  Filled 2018-03-03 (×21): qty 2000

## 2018-03-03 MED ORDER — POTASSIUM CHLORIDE 20 MEQ/15ML (10%) PO SOLN: 40.0000 meq | Freq: Once | ORAL | Status: DC

## 2018-03-03 MED ORDER — POTASSIUM PHOSPHATES 15 MMOLE/5ML IV SOLN
20.0000 mmol | Freq: Once | INTRAVENOUS | Status: AC
  Administered 2018-03-03: 20 mmol via INTRAVENOUS
  Filled 2018-03-03: qty 6.67

## 2018-03-03 NOTE — Progress Notes (Signed)
NAME:  Jacqueline Pratt, MRN:  [id number], DOB:  [birth year], LOS: 5 ADMISSION DATE:  03/22/2018, CONSULTATION DATE:  03/13/2018 REFERRING MD:  Kerney Elbe, CHIEF COMPLAINT:  Found down   Brief History   63F found down, 8 cm basal ganglia hemorrhage. Intubated in ED   Past Medical History  Alcohol abuse, Hypertension & Seizures  Significant Hospital Events   1/04 Admit with AMS 1/6 remains on Cleviprex. Agitated at times. No sig change  1/7: Weaning.  Neuro exam the same.  Blood pressure controlled.  Sputum sent for right lower lobe infiltrate and mild leukocytosis 1/8: CT head stable.  Growing staph in sputum empirically starting antibiotics.  Working on Careers adviser. 03/03/2026 Depakote and Xanax to pharmaceutical regimen due to history of anxiolytic use and EtOH abuse. 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  No significant change in large intraparenchymal hematoma in the right basal ganglia slight increase in localized vasogenic edema ongoing mass-effect right to left shift CT brain 1/7:no sig change  Micro Data:   Respiratory culture 1/7: SA>>> Antimicrobials:  vanc 1/8 >>> 03/03/2018 Nafcillin 03/03/2018>>   Subjective:  Continue to try to wean but-we will extubate on 03/03/2018 Adding Depakote and Xanax to pharmaceutical regimen Objective   Blood pressure (!) 139/56, pulse 70, temperature 98.6 F (37 C), temperature source Axillary, resp. rate 19, height 5\' 2"  (1.575 m), weight 90.7 kg, SpO2 95 %.    Vent Mode: PRVC FiO2 (%):  [40 %-55 %] 50 % Set Rate:  [18 bmp] 18 bmp Vt Set:  [430 mL] 430 mL PEEP:  [5 cmH20-6 cmH20] 5 cmH20 Pressure Support:  [5 cmH20-8 cmH20] 8  cmH20 Plateau Pressure:  [15 cmH20-20 cmH20] 15 cmH20   Intake/Output Summary (Last 24 hours) at 03/03/2018 1005 Last data filed at 03/03/2018 0800 Gross per 24 hour  Intake 3127.48 ml  Output 1950 ml  Net 1177.48 ml   Filed Weights   03/01/18 0500 03/02/18 0500 03/03/18 0400  Weight: 89.3 kg 89.5 kg 90.7 kg    Examination: General: 53 year old female who is either agitated or sedated.  Note on 03/03/2018 were adding Depakote and Xanax for her pharmaceutical regimen HEENT: MM pink/moist, endotracheal tube connected to ventilator, orogastric tube with tube feedings in place Neuro: Either agitated or sedated.  Left side hemiplegia. CV: Heart sounds are regular regular rate and rhythm PULM: even/non-labored, lungs bilaterally diminished in the bases bilaterally ZC:HYIF, non-tender, bsx4 active  Extremities: warm/dry, 1+ edema  Skin: no rashes or lesions     ag metabolic acidosis Renal failure (AKI resolved 1/5) rhabdo  Leukocytosis  Assessment & Plan:   53 year old female history of alcohol abuse, hypertension, and seizures who was brought in after being found down with unknown last normal. Found to have 8.4 cm right basal ganglia hemorrhage.  Noted to have alcohol anxiety issues home setting.  Right basal ganglia hemorrhage Malignant hypertension -family declined surgery  P: Sodium goal per neurology Systolic blood pressure goal less than 140 greater than 130 utilizing Cleviprex, currently on Norvasc 10 mg which is 5 mg higher than her home dose Serial neuro checks Follow head CT as per neurology   Acute Respiratory Insufficiency in setting of ICH   -intubation  for airway protection Portable chest x-ray personally reviewed: p: Daily spontaneous breathing trial as tolerated.  Hopefully adding Depakote and Xanax will help with her agitation issues RA SS goal of 0    RLL Staph pneumonia  Follow-up sputum cultures P: Day 1 of IV vancomycin.  Narrowed to nafcillin on  03/03/2018.  Day 0 of nafcillin 03/03/2018 procalcitonin blood cultures ordered  Fluid and electrolyte imbalance: Hypernatremia, Hyperkalemia  (therapeutic)->protocol stopped 1/6 after Na was > 160 03/03/2018 currently on 3% saline. P:  Sodium currently 152, potassium 2.8, replete potassium, replete phosphorus, recheck electrolytes 1500 hrs. today Daily chemistries  Alcohol abuse P: Continue vitamin therapy with thiamine folate PRN Ativan note she was on Xanax at home prior to admission 03/03/2018 after discussion with neurology we will add Depakote and Xanax to her daily pharmaceutical regimen.  Anemia  -no evidence of bleeding currently  P:  Monitor CBC as needed  Best practice:  Diet: NPO, currently on tube feedings Pain/Anxiety/Delirium protocol (if indicated): fentanyl, note she has history of anxiety is on as needed Xanax at home. VAP protocol (if indicated): ordered DVT prophylaxis: contraindicated with bleed, scd's GI prophylaxis: PPI Glucose control: sliding scale insulin Mobility: contraindicated at this time Code Status: full code Family Communication: 03/03/2018 mother updated at bedside Disposition: ICU under neurology care; currently with thick frothy secretions, recently started on antimicrobial therapy, will wean but probably not extubate on 03/03/2018   Critical care time:  30 minutes     Richardson Landry Kerrington Greenhalgh ACNP Maryanna Shape PCCM Pager [phone] till 1 pm If no answer page [phone] 03/03/2018, 10:05 AM

## 2018-03-03 NOTE — Progress Notes (Signed)
[facility] Progress Note Patient Name: Jacqueline Pratt DOB: [birth year] MRN: [id number]   Date of Service  03/03/2018  HPI/Events of Note  Informed by bedside ICU RN that pt has been hypoxic on current MV settings with FiO2 45% with O2 sats in the high 80s-low 90s.  CXR from 2 days prior showing atelectasis. RT reported no significant secretions.   eICU Interventions  Increase FiO2 to 55%. Repeat CXR. Concerned about consolidation and pt may benefit from chest PT. Check ABG in the AM.      Intervention Category Minor Interventions: Other:  Elsie Lincoln 03/03/2018, 2:01 AM

## 2018-03-03 NOTE — Progress Notes (Signed)
STROKE TEAM PROGRESS NOTE   SUBJECTIVE :    her sister, is at the bedside. She remains on sedation.  She remains agitated requiring sedation. Her secretions are better but oxygen requirements went up last night. She is on antibiotics and is on hypertonic saline and sodium level is 152 this am  She is   arousable today and follows simple commands  Past Medical History:  Diagnosis Date  . Anxiety   . Depression   . Hypertension   . Migraines   . Seizures ([place])    last seizure 3 years ago per pt    Past Surgical History:  Procedure Laterality Date  . EXAM UNDER ANESTHESIA WITH MANIPULATION OF SHOULDER    . INCISION AND DRAINAGE     left lower side- staph infection  . STAPEDES SURGERY      Family History  Problem Relation Age of Onset  . Diabetes Mother   . Non-Hodgkin's lymphoma Sister   . Rectal cancer Maternal Grandfather   . Hypertension Maternal Grandfather   . Diabetes Maternal Grandfather   . Colon cancer Neg Hx    Social History:  reports that she has quit smoking. She has never used smokeless tobacco. She reports current alcohol use of about 14.0 standard drinks of alcohol per week. She reports that she does not use drugs.  Allergies:  Allergies  Allergen Reactions  . Aspirin Other (See Comments) and Nausea And Vomiting  . Lovastatin Diarrhea  . Simvastatin Diarrhea  . Statins Other (See Comments)    Diarrhea   . Tetracyclines & Related Nausea Only    Home Medications   ROS: Unable to obtain due to sedation  Physical Examination: Blood pressure (!) 146/59, pulse 84, temperature 99.5 F (37.5 C), temperature source Axillary, resp. rate 19, height 5\' 2"  (1.575 m), weight 90.7 kg, SpO2 94 %.  HEENT-  [place]/AT  Lungs - Intubated on ventilator Extremities - Warm and well perfused  Neurologic Examination: Mental Status: Intubated and sedated  Barely opens eyes to sternal rub. Will not follow commands consistently.Marland Kitchen No eye opening spontaneously or to  command. Flails RUE and RLE to sternal rub. Trace withdrawal on the left upper extremity and lower extremity. Cranial Nerves: II:  Pupils 58mm bilaterally and unreactive (sedated)- left slugglish. No blink to threat.  Fundi not visualized III,IV, VI: No doll's eye reflex (sedated). Positive corneal reflexes  VII: Face flaccidly symmetric.  VIII: No response to voice IX,X: Intubated XI: Unable to assess XII: positive gag with introduction of inline cath. Mildly delayed Motor/Sensory: RUE: Flails nonpurposefully to noxious stimuli.  RLE: Withdraws to noxious and flails nonpurposefully intermittently LUE: Increased extensor tone.no movement with noxious stimuli LLE: Increased extensor tone. Intermittent Withdrawal to painMuscle bulk is normal x 4  Cerebellar/Gait: Unable to assess   Results for orders placed or performed during the hospital encounter of 02/23/2018 (from the past 48 hour(s))  Magnesium     Status: None   Collection Time: 03/01/18  7:45 PM  Result Value Ref Range   Magnesium 2.2 1.7 - 2.4 mg/dL    Comment: Performed at [facility], [place] [address], [place], [place] [address]  Phosphorus     Status: Abnormal   Collection Time: 03/01/18  7:45 PM  Result Value Ref Range   Phosphorus 2.0 (L) 2.5 - 4.6 mg/dL    Comment: Performed at [facility] [address] [place], [place] [address]  BMET today at 2000  Status: Abnormal   Collection Time: 03/01/18  7:45 PM  Result Value Ref Range   Sodium 154 (H) 135 - 145 mmol/L   Potassium 3.5 3.5 - 5.1 mmol/L   Chloride 120 (H) 98 - 111 mmol/L   CO2 25 22 - 32 mmol/L   Glucose, Bld 118 (H) 70 - 99 mg/dL   BUN 19 6 - 20 mg/dL   Creatinine, Ser 0.83 0.44 - 1.00 mg/dL   Calcium 9.2 8.9 - 10.3 mg/dL   GFR calc non Af Amer >60 >60 mL/min   GFR calc Af Amer >60 >60 mL/min   Anion gap 9 5 - 15    Comment: Performed at [facility], [place] [address], [place], [place] [address]  Glucose, capillary     Status: Abnormal    Collection Time: 03/01/18  7:59 PM  Result Value Ref Range   Glucose-Capillary 101 (H) 70 - 99 mg/dL  Glucose, capillary     Status: None   Collection Time: 03/01/18 11:41 PM  Result Value Ref Range   Glucose-Capillary 91 70 - 99 mg/dL  Sodium     Status: Abnormal   Collection Time: 03/02/18  3:20 AM  Result Value Ref Range   Sodium 151 (H) 135 - 145 mmol/L    Comment: Performed at [facility], [place] [address], [place], [place] [address]  Glucose, capillary     Status: Abnormal   Collection Time: 03/02/18  3:48 AM  Result Value Ref Range   Glucose-Capillary 134 (H) 70 - 99 mg/dL  Glucose, capillary     Status: Abnormal   Collection Time: 03/02/18  7:53 AM  Result Value Ref Range   Glucose-Capillary 118 (H) 70 - 99 mg/dL   Comment 1 Notify RN    Comment 2 Document in Chart   Sodium     Status: Abnormal   Collection Time: 03/02/18  9:04 AM  Result Value Ref Range   Sodium 150 (H) 135 - 145 mmol/L    Comment: Performed at [facility], [place] [address], [place], [place] [address]  Basic metabolic panel     Status: Abnormal   Collection Time: 03/02/18  9:36 AM  Result Value Ref Range   Sodium 149 (H) 135 - 145 mmol/L   Potassium 3.3 (L) 3.5 - 5.1 mmol/L   Chloride 114 (H) 98 - 111 mmol/L   CO2 25 22 - 32 mmol/L   Glucose, Bld 131 (H) 70 - 99 mg/dL   BUN 16 6 - 20 mg/dL   Creatinine, Ser 0.82 0.44 - 1.00 mg/dL   Calcium 9.3 8.9 - 10.3 mg/dL   GFR calc non Af Amer >60 >60 mL/min   GFR calc Af Amer >60 >60 mL/min   Anion gap 10 5 - 15    Comment: Performed at [facility], [place] [address], [place], [place] [address]  Glucose, capillary     Status: Abnormal   Collection Time: 03/02/18 11:49 AM  Result Value Ref Range   Glucose-Capillary 188 (H) 70 - 99 mg/dL   Comment 1 Notify RN    Comment 2 Document in Chart   Glucose, capillary     Status: Abnormal   Collection Time: 03/02/18  4:11 PM  Result Value Ref Range   Glucose-Capillary 102 (H) 70 - 99 mg/dL    Comment 1 Notify RN    Comment 2 Document in Chart   Sodium     Status: Abnormal   Collection  Time: 03/02/18  6:45 PM  Result Value Ref Range   Sodium 150 (H) 135 - 145 mmol/L    Comment: Performed at [facility], [place] [address], [place], [place] [address]  Glucose, capillary     Status: None   Collection Time: 03/02/18  7:54 PM  Result Value Ref Range   Glucose-Capillary 98 70 - 99 mg/dL  Glucose, capillary     Status: Abnormal   Collection Time: 03/02/18 11:49 PM  Result Value Ref Range   Glucose-Capillary 124 (H) 70 - 99 mg/dL  Sodium     Status: Abnormal   Collection Time: 03/03/18 12:02 AM  Result Value Ref Range   Sodium 151 (H) 135 - 145 mmol/L    Comment: Performed at [facility] [address], [place], [place] [address]  Glucose, capillary     Status: Abnormal   Collection Time: 03/03/18  3:42 AM  Result Value Ref Range   Glucose-Capillary 127 (H) 70 - 99 mg/dL  Basic metabolic panel     Status: Abnormal   Collection Time: 03/03/18  4:42 AM  Result Value Ref Range   Sodium 152 (H) 135 - 145 mmol/L   Potassium 2.8 (L) 3.5 - 5.1 mmol/L   Chloride 119 (H) 98 - 111 mmol/L   CO2 23 22 - 32 mmol/L   Glucose, Bld 131 (H) 70 - 99 mg/dL   BUN 17 6 - 20 mg/dL   Creatinine, Ser 0.77 0.44 - 1.00 mg/dL   Calcium 8.5 (L) 8.9 - 10.3 mg/dL   GFR calc non Af Amer >60 >60 mL/min   GFR calc Af Amer >60 >60 mL/min   Anion gap 10 5 - 15    Comment: Performed at [facility], [place] [address] [place], [place] [address]  CBC     Status: Abnormal   Collection Time: 03/03/18  4:42 AM  Result Value Ref Range   WBC 12.5 (H) 4.0 - 10.5 K/uL   RBC 4.12 3.87 - 5.11 MIL/uL   Hemoglobin 12.0 12.0 - 15.0 g/dL   HCT 39.4 36.0 - 46.0 %   MCV 95.6 80.0 - 100.0 fL   MCH 29.1 26.0 - 34.0 pg   MCHC 30.5 30.0 - 36.0 g/dL   RDW 13.4 11.5 - 15.5 %   Platelets 227 150 - 400 K/uL   nRBC 0.0 0.0 - 0.2 %    Comment: Performed at [facility], [place] [address]  [place], [place] [address]  Glucose, capillary     Status: Abnormal   Collection Time: 03/03/18  8:05 AM  Result Value Ref Range   Glucose-Capillary 148 (H) 70 - 99 mg/dL   Comment 1 Notify RN    Comment 2 Document in Chart   Procalcitonin - Baseline     Status: None   Collection Time: 03/03/18 10:17 AM  Result Value Ref Range   Procalcitonin <0.10 ng/mL    Comment:        Interpretation: PCT (Procalcitonin) <= 0.5 ng/mL: Systemic infection (sepsis) is not likely. Local bacterial infection is possible. (NOTE)       Sepsis PCT Algorithm           Lower Respiratory Tract                                      Infection PCT Algorithm    ----------------------------     ----------------------------  PCT < 0.25 ng/mL                PCT < 0.10 ng/mL         Strongly encourage             Strongly discourage   discontinuation of antibiotics    initiation of antibiotics    ----------------------------     -----------------------------       PCT 0.25 - 0.50 ng/mL            PCT 0.10 - 0.25 ng/mL               OR       >80% decrease in PCT            Discourage initiation of                                            antibiotics      Encourage discontinuation           of antibiotics    ----------------------------     -----------------------------         PCT >= 0.50 ng/mL              PCT 0.26 - 0.50 ng/mL               AND        <80% decrease in PCT             Encourage initiation of                                             antibiotics       Encourage continuation           of antibiotics    ----------------------------     -----------------------------        PCT >= 0.50 ng/mL                  PCT > 0.50 ng/mL               AND         increase in PCT                  Strongly encourage                                      initiation of antibiotics    Strongly encourage escalation           of antibiotics                                     -----------------------------                                            PCT <= 0.25 ng/mL  OR                                        > 80% decrease in PCT                                     Discontinue / Do not initiate                                             antibiotics Performed at [facility], [place] [address] [place], [place] [address]   Glucose, capillary     Status: Abnormal   Collection Time: 03/03/18 12:08 PM  Result Value Ref Range   Glucose-Capillary 152 (H) 70 - 99 mg/dL   Comment 1 Notify RN    Comment 2 Document in Chart   Glucose, capillary     Status: Abnormal   Collection Time: 03/03/18  4:09 PM  Result Value Ref Range   Glucose-Capillary 150 (H) 70 - 99 mg/dL   Comment 1 Notify RN    Comment 2 Document in Chart    Ct Head Wo Contrast  Result Date: 03/02/2018 CLINICAL DATA:  Follow-up examination for intracranial hemorrhage. EXAM: CT HEAD WITHOUT CONTRAST TECHNIQUE: Contiguous axial images were obtained from the base of the skull through the vertex without intravenous contrast. COMPARISON:  Prior CT from 02/28/2018 FINDINGS: Brain: Large intraparenchymal hemorrhage centered at the right basal ganglia measures 7.9 x 3.9 x 4.5 cm on today's study, slightly decreased from previous. Surrounding low-density vasogenic edema with regional mass effect relatively similar there remains up to approximately 10 mm right-to-left shift, unchanged. Intraventricular extension with blood in the lateral and third ventricles, not significantly changed in volume. Stable ventricular size and morphology without worsening hydrocephalus or ventricular trapping. No other new acute intracranial hemorrhage. No acute large vessel territory infarct. No extra-axial collection. Vascular: No hyperdense vessel. Skull: Scalp soft tissues and calvarium demonstrate no acute finding. Sinuses/Orbits: Globes and orbital soft tissues within normal limits. Moderate mucosal thickening throughout  the paranasal sinuses with associated air-fluid levels. Fluid within the nasopharynx. Patient likely intubated. Small bilateral mastoid effusions noted. Other: None. IMPRESSION: 1. Slight interval decrease in size of large intraparenchymal hemorrhage centered at the right basal ganglia, now measuring 7.9 x 3.9 x 4.5 cm on today's study. 2. Similar regional mass effect with up to 10 mm right-to-left shift. 3. Similar intraventricular extension with blood in the lateral and third ventricles. Overall ventricular size and morphology relatively stable without worsening hydrocephalus or ventricular trapping. 4. No other new acute intracranial abnormality. Electronically Signed   By: Jeannine Boga M.D.   On: 03/02/2018 05:10   Dg Chest Port 1 View  Result Date: 03/03/2018 CLINICAL DATA:  Shortness of breath.  Oxygen desaturation. EXAM: PORTABLE CHEST 1 VIEW COMPARISON:  Radiograph 03/01/2018 FINDINGS: Endotracheal tube tip 3 cm from the carina. Weighted enteric tube tip below the diaphragm, not included in the field of view. Right upper extremity central venous catheter tip in the distal SVC. Improving right lung base aeration with persistent but improving opacity. Prior pleural effusion is not as well seen. Improved retrocardiac opacity from prior. Unchanged heart size and mediastinal contours. No pulmonary edema or  pneumothorax. IMPRESSION: 1. Improving right basilar aeration with persistent but improving opacity. The prior pleural effusion is not as well seen. Improving retrocardiac atelectasis. 2. Otherwise unchanged exam. Endotracheal tube and right central line in place. Weighted enteric tube tip is below the diaphragm, not included in the field of view. Electronically Signed   By: Keith Rake M.D.   On: 03/03/2018 03:23   CT head: 1. Acute hemorrhage centered in right basal ganglia with extension into right cerebral hemisphere measuring up to 8.4 cm, 79 CC. 2. Extension of hemorrhage into the  intraventricular system, greatest in the right lateral ventricle. 3. Mass effect with 10 mm right-to-left midline shift, right-sided uncal herniation, and partial effacement of basilar cisterns. No downward herniation at this time.  CT cervical spine. 1. No acute fracture or dislocation of the cervical spine. 2. 15 mm nodule within left lobe of thyroid gland. Further evaluation with thyroid ultrasound is recommended on a nonemergent basis. 3. Mild spondylosis of the cervical spine.  CT brain repeat 02/27/18 1. Similar to slightly decreased size of large intraparenchymal hematoma centered at the right basal ganglia, estimated volume 71.9 cc on current exam, previously 75 cc. 2. Similar regional mass effect with up to 10 mm of right-to-left midline shift. 3. Associated intraventricular extension with blood primarily in the lateral and third ventricles, similar to previous. 4. No other new intracranial abnormality.  Assessment: 53 y.o. female with acute right basal ganglia hemorrhage etiology likely hypertensive 1. The hemorrhage measures 75 cc=-slightly improved There is extension into the right cerebral hemisphere and ventricles as well as mass effect with 10 mm right to left midline shift, right sided uncal herniation and partial effacement of the basilar cisterns.        Plan:continuet hypertonic saline with serum sodium goal 150-155. Strict blood pressure control with SBP goal below 160 Maintain normothermia, euglycemia and euvolemic.Marland KitchenMarland KitchenContinue Naficillin Long d/w sister at bedside and answered questions about prognosis. D/w  Fayne Mediate This patient is critically ill and at significant risk of neurological worsening, death and care requires constant monitoring of vital signs, hemodynamics,respiratory and cardiac monitoring, extensive review of multiple databases, frequent neurological assessment, discussion with family, other specialists and medical decision making of high complexity.I  have made any additions or clarifications directly to the above note.This critical care time does not reflect procedure time, or teaching time or supervisory time of PA/NP/Med Resident etc but could involve care discussion time.  I spent 30 minutes of neurocritical care time  in the care of  this patient.     Antony Contras, MD Medical Director [facility] Pager: [phone] 03/03/2018 4:51 PM  03/03/2018, 4:51 PM

## 2018-03-04 ENCOUNTER — Inpatient Hospital Stay (HOSPITAL_COMMUNITY): Payer: Managed Care, Other (non HMO)

## 2018-03-04 DIAGNOSIS — I611 Nontraumatic intracerebral hemorrhage in hemisphere, cortical: Secondary | ICD-10-CM

## 2018-03-04 LAB — CBC WITH DIFFERENTIAL/PLATELET
Abs Immature Granulocytes: 0.54 10*3/uL — ABNORMAL HIGH (ref 0.00–0.07)
Basophils Absolute: 0.1 10*3/uL (ref 0.0–0.1)
Basophils Relative: 1 %
Eosinophils Absolute: 0.4 10*3/uL (ref 0.0–0.5)
Eosinophils Relative: 3 %
HCT: 41.6 % (ref 36.0–46.0)
HEMOGLOBIN: 13.1 g/dL (ref 12.0–15.0)
Immature Granulocytes: 4 %
LYMPHS ABS: 2.3 10*3/uL (ref 0.7–4.0)
Lymphocytes Relative: 16 %
MCH: 29.6 pg (ref 26.0–34.0)
MCHC: 31.5 g/dL (ref 30.0–36.0)
MCV: 93.9 fL (ref 80.0–100.0)
MONOS PCT: 10 %
Monocytes Absolute: 1.5 10*3/uL — ABNORMAL HIGH (ref 0.1–1.0)
Neutro Abs: 9.5 10*3/uL — ABNORMAL HIGH (ref 1.7–7.7)
Neutrophils Relative %: 66 %
Platelets: 263 10*3/uL (ref 150–400)
RBC: 4.43 MIL/uL (ref 3.87–5.11)
RDW: 13.5 % (ref 11.5–15.5)
WBC: 14.2 10*3/uL — ABNORMAL HIGH (ref 4.0–10.5)
nRBC: 0.3 % — ABNORMAL HIGH (ref 0.0–0.2)

## 2018-03-04 LAB — BASIC METABOLIC PANEL
Anion gap: 8 (ref 5–15)
Anion gap: 8 (ref 5–15)
BUN: 18 mg/dL (ref 6–20)
BUN: 20 mg/dL (ref 6–20)
CHLORIDE: 120 mmol/L — AB (ref 98–111)
CO2: 25 mmol/L (ref 22–32)
CO2: 27 mmol/L (ref 22–32)
Calcium: 9 mg/dL (ref 8.9–10.3)
Calcium: 8.7 mg/dL — ABNORMAL LOW (ref 8.9–10.3)
Chloride: 121 mmol/L — ABNORMAL HIGH (ref 98–111)
Creatinine, Ser: 0.83 mg/dL (ref 0.44–1.00)
Creatinine, Ser: 0.77 mg/dL (ref 0.44–1.00)
GFR calc Af Amer: >60 mL/min (ref >60)
GFR calc Af Amer: >60 mL/min (ref >60)
GFR calc non Af Amer: >60 mL/min (ref >60)
GFR calc non Af Amer: >60 mL/min (ref >60)
Glucose, Bld: 129 mg/dL — ABNORMAL HIGH (ref 70–99)
Glucose, Bld: 128 mg/dL — ABNORMAL HIGH (ref 70–99)
Potassium: 3.2 mmol/L — ABNORMAL LOW (ref 3.5–5.1)
Potassium: 3 mmol/L — ABNORMAL LOW (ref 3.5–5.1)
Sodium: 153 mmol/L — ABNORMAL HIGH (ref 135–145)
Sodium: 156 mmol/L — ABNORMAL HIGH (ref 135–145)

## 2018-03-04 LAB — GLUCOSE, CAPILLARY
GLUCOSE-CAPILLARY: 124 mg/dL — AB (ref 70–99)
Glucose-Capillary: 103 mg/dL — ABNORMAL HIGH (ref 70–99)
Glucose-Capillary: 132 mg/dL — ABNORMAL HIGH (ref 70–99)
Glucose-Capillary: 133 mg/dL — ABNORMAL HIGH (ref 70–99)
Glucose-Capillary: 140 mg/dL — ABNORMAL HIGH (ref 70–99)
Glucose-Capillary: 143 mg/dL — ABNORMAL HIGH (ref 70–99)

## 2018-03-04 LAB — PHOSPHORUS: Phosphorus: 2.2 mg/dL — ABNORMAL LOW (ref 2.5–4.6)

## 2018-03-04 LAB — SODIUM
Sodium: 146 mmol/L — ABNORMAL HIGH (ref 135–145)
Sodium: 154 mmol/L — ABNORMAL HIGH (ref 135–145)

## 2018-03-04 LAB — MAGNESIUM: Magnesium: 2.4 mg/dL (ref 1.7–2.4)

## 2018-03-04 MED ORDER — POTASSIUM PHOSPHATES 15 MMOLE/5ML IV SOLN
20.0000 mmol | Freq: Once | INTRAVENOUS | Status: AC
  Administered 2018-03-04: 20 mmol via INTRAVENOUS
  Filled 2018-03-04: qty 6.67

## 2018-03-04 MED ORDER — VITAL HIGH PROTEIN PO LIQD
1000.0000 mL | ORAL | Status: DC
  Administered 2018-03-04 – 2018-03-07 (×4): 1000 mL
  Filled 2018-03-04 (×4): qty 1000

## 2018-03-04 MED ORDER — INSULIN ASPART 100 UNIT/ML ~~LOC~~ SOLN
0.0000 [IU] | SUBCUTANEOUS | Status: DC
  Administered 2018-03-04 – 2018-03-07 (×15): 2 [IU] via SUBCUTANEOUS
  Administered 2018-03-07: 3 [IU] via SUBCUTANEOUS
  Administered 2018-03-07: 2 [IU] via SUBCUTANEOUS
  Administered 2018-03-07 – 2018-03-08 (×2): 3 [IU] via SUBCUTANEOUS

## 2018-03-04 MED ORDER — METOPROLOL TARTRATE 5 MG/5ML IV SOLN: 2.5000 mg | INTRAVENOUS | Status: DC | PRN

## 2018-03-04 MED ORDER — POTASSIUM PHOSPHATES 15 MMOLE/5ML IV SOLN: 20.0000 mmol | Freq: Once | INTRAVENOUS | Status: DC

## 2018-03-04 NOTE — Progress Notes (Signed)
STROKE TEAM PROGRESS NOTE   SUBJECTIVE :    Her mother, is at the bedside.   She remains agitated requiring sedation. She is not following commands . She is on antibiotics and is on hypertonic saline and sodium level is 153 this am     Past Medical History:  Diagnosis Date  . Anxiety   . Depression   . Hypertension   . Migraines   . Seizures ([facility])    last seizure 3 years ago per pt    Past Surgical History:  Procedure Laterality Date  . EXAM UNDER ANESTHESIA WITH MANIPULATION OF SHOULDER    . INCISION AND DRAINAGE     left lower side- staph infection  . STAPEDES SURGERY      Family History  Problem Relation Age of Onset  . Diabetes Mother   . Non-Hodgkin's lymphoma Sister   . Rectal cancer Maternal Grandfather   . Hypertension Maternal Grandfather   . Diabetes Maternal Grandfather   . Colon cancer Neg Hx    Social History:  reports that she has quit smoking. She has never used smokeless tobacco. She reports current alcohol use of about 14.0 standard drinks of alcohol per week. She reports that she does not use drugs.  Allergies:  Allergies  Allergen Reactions  . Aspirin Other (See Comments) and Nausea And Vomiting  . Lovastatin Diarrhea  . Simvastatin Diarrhea  . Statins Other (See Comments)    Diarrhea   . Tetracyclines & Related Nausea Only    Home Medications   ROS: Unable to obtain due to sedation  Physical Examination: Blood pressure (!) 176/100, pulse 73, temperature (!) 100.4 F (38 C), temperature source Axillary, resp. rate (!) 31, height 5\' 2"  (1.575 m), weight 90.3 kg, SpO2 99 %.  HEENT-  [place]/AT  Lungs - Intubated on ventilator Extremities - Warm and well perfused  Neurologic Examination: Mental Status: Intubated and sedated  Barely opens eyes to sternal rub. Will not follow commands consistently.Marland Kitchen No eye opening spontaneously or to command. Flails RUE and RLE to sternal rub. Trace withdrawal on the left upper extremity and lower  extremity. Cranial Nerves: II:  Pupils 26mm bilaterally and unreactive (sedated)- left slugglish. No blink to threat.  Fundi not visualized III,IV, VI: No doll's eye reflex (sedated). Positive corneal reflexes  VII: Face flaccidly symmetric.  VIII: No response to voice IX,X: Intubated XI: Unable to assess XII: positive gag with introduction of inline cath. Mildly delayed Motor/Sensory: RUE: Flails nonpurposefully to noxious stimuli.  RLE: Withdraws to noxious and flails nonpurposefully intermittently LUE: Increased extensor tone.no movement with noxious stimuli LLE: Increased extensor tone. Intermittent Withdrawal to painMuscle bulk is normal x 4  Cerebellar/Gait: Unable to assess   Results for orders placed or performed during the hospital encounter of 03/15/2018 (from the past 48 hour(s))  Glucose, capillary     Status: Abnormal   Collection Time: 03/02/18  4:11 PM  Result Value Ref Range   Glucose-Capillary 102 (H) 70 - 99 mg/dL   Comment 1 Notify RN    Comment 2 Document in Chart   Sodium     Status: Abnormal   Collection Time: 03/02/18  6:45 PM  Result Value Ref Range   Sodium 150 (H) 135 - 145 mmol/L    Comment: Performed at [facility], [place] [address], [place], [place] [address]  Glucose, capillary     Status: None   Collection Time: 03/02/18  7:54 PM  Result Value Ref Range  Glucose-Capillary 98 70 - 99 mg/dL  Glucose, capillary     Status: Abnormal   Collection Time: 03/02/18 11:49 PM  Result Value Ref Range   Glucose-Capillary 124 (H) 70 - 99 mg/dL  Sodium     Status: Abnormal   Collection Time: 03/03/18 12:02 AM  Result Value Ref Range   Sodium 151 (H) 135 - 145 mmol/L    Comment: Performed at [facility], [place] [address] [place], [place] [address]  Glucose, capillary     Status: Abnormal   Collection Time: 03/03/18  3:42 AM  Result Value Ref Range   Glucose-Capillary 127 (H) 70 - 99 mg/dL  Basic metabolic panel     Status: Abnormal   Collection  Time: 03/03/18  4:42 AM  Result Value Ref Range   Sodium 152 (H) 135 - 145 mmol/L   Potassium 2.8 (L) 3.5 - 5.1 mmol/L   Chloride 119 (H) 98 - 111 mmol/L   CO2 23 22 - 32 mmol/L   Glucose, Bld 131 (H) 70 - 99 mg/dL   BUN 17 6 - 20 mg/dL   Creatinine, Ser 0.77 0.44 - 1.00 mg/dL   Calcium 8.5 (L) 8.9 - 10.3 mg/dL   GFR calc non Af Amer >60 >60 mL/min   GFR calc Af Amer >60 >60 mL/min   Anion gap 10 5 - 15    Comment: Performed at [facility], [place] [address], [place], [place] [address]  CBC     Status: Abnormal   Collection Time: 03/03/18  4:42 AM  Result Value Ref Range   WBC 12.5 (H) 4.0 - 10.5 K/uL   RBC 4.12 3.87 - 5.11 MIL/uL   Hemoglobin 12.0 12.0 - 15.0 g/dL   HCT 39.4 36.0 - 46.0 %   MCV 95.6 80.0 - 100.0 fL   MCH 29.1 26.0 - 34.0 pg   MCHC 30.5 30.0 - 36.0 g/dL   RDW 13.4 11.5 - 15.5 %   Platelets 227 150 - 400 K/uL   nRBC 0.0 0.0 - 0.2 %    Comment: Performed at [facility], [place] [employer] [address], [place], [place] [address]  Glucose, capillary     Status: Abnormal   Collection Time: 03/03/18  8:05 AM  Result Value Ref Range   Glucose-Capillary 148 (H) 70 - 99 mg/dL   Comment 1 Notify RN    Comment 2 Document in Chart   Procalcitonin - Baseline     Status: None   Collection Time: 03/03/18 10:17 AM  Result Value Ref Range   Procalcitonin <0.10 ng/mL    Comment:        Interpretation: PCT (Procalcitonin) <= 0.5 ng/mL: Systemic infection (sepsis) is not likely. Local bacterial infection is possible. (NOTE)       Sepsis PCT Algorithm           Lower Respiratory Tract                                      Infection PCT Algorithm    ----------------------------     ----------------------------         PCT < 0.25 ng/mL                PCT < 0.10 ng/mL         Strongly encourage             Strongly discourage  discontinuation of antibiotics    initiation of antibiotics    ----------------------------     -----------------------------       PCT 0.25 - 0.50 ng/mL             PCT 0.10 - 0.25 ng/mL               OR       >80% decrease in PCT            Discourage initiation of                                            antibiotics      Encourage discontinuation           of antibiotics    ----------------------------     -----------------------------         PCT >= 0.50 ng/mL              PCT 0.26 - 0.50 ng/mL               AND        <80% decrease in PCT             Encourage initiation of                                             antibiotics       Encourage continuation           of antibiotics    ----------------------------     -----------------------------        PCT >= 0.50 ng/mL                  PCT > 0.50 ng/mL               AND         increase in PCT                  Strongly encourage                                      initiation of antibiotics    Strongly encourage escalation           of antibiotics                                     -----------------------------                                           PCT <= 0.25 ng/mL                                                 OR                                        >  80% decrease in PCT                                     Discontinue / Do not initiate                                             antibiotics Performed at [facility], [place] [address], [place], [place] [address]   Culture, blood (Routine X 2) w Reflex to ID Panel     Status: None (Preliminary result)   Collection Time: 03/03/18 10:56 AM  Result Value Ref Range   Specimen Description BLOOD LEFT HAND    Special Requests      BOTTLES DRAWN AEROBIC AND ANAEROBIC Blood Culture adequate volume   Culture      NO GROWTH 1 DAY Performed at [facility], [place] [address], [place], [place] [address]    Report Status PENDING   Culture, blood (Routine X 2) w Reflex to ID Panel     Status: None (Preliminary result)   Collection Time: 03/03/18 11:07 AM  Result Value Ref Range   Specimen Description BLOOD LEFT HAND     Special Requests      BOTTLES DRAWN AEROBIC AND ANAEROBIC Blood Culture results may not be optimal due to an excessive volume of blood received in culture bottles   Culture      NO GROWTH 1 DAY Performed at [place] [address], [place], [place] [address]    Report Status PENDING   Glucose, capillary     Status: Abnormal   Collection Time: 03/03/18 12:08 PM  Result Value Ref Range   Glucose-Capillary 152 (H) 70 - 99 mg/dL   Comment 1 Notify RN    Comment 2 Document in Chart   Glucose, capillary     Status: Abnormal   Collection Time: 03/03/18  4:09 PM  Result Value Ref Range   Glucose-Capillary 150 (H) 70 - 99 mg/dL   Comment 1 Notify RN    Comment 2 Document in Chart   Basic metabolic panel     Status: Abnormal   Collection Time: 03/03/18  5:55 PM  Result Value Ref Range   Sodium 155 (H) 135 - 145 mmol/L   Potassium 2.6 (LL) 3.5 - 5.1 mmol/L    Comment: CRITICAL RESULT CALLED TO, READ BACK BY AND VERIFIED WITH: Demetrius Charity 03/03/18 D BRADLEY    Chloride 121 (H) 98 - 111 mmol/L   CO2 27 22 - 32 mmol/L   Glucose, Bld 140 (H) 70 - 99 mg/dL   BUN 18 6 - 20 mg/dL   Creatinine, Ser 0.75 0.44 - 1.00 mg/dL   Calcium 8.6 (L) 8.9 - 10.3 mg/dL   GFR calc non Af Amer >60 >60 mL/min   GFR calc Af Amer >60 >60 mL/min   Anion gap 7 5 - 15    Comment: Performed at [facility], [place] [address] [place], [place] [address]  Glucose, capillary     Status: Abnormal   Collection Time: 03/03/18  8:00 PM  Result Value Ref Range   Glucose-Capillary 103 (H) 70 - 99 mg/dL  Glucose, capillary     Status: Abnormal   Collection Time: 03/03/18 11:27 PM  Result Value Ref Range  Glucose-Capillary 142 (H) 70 - 99 mg/dL  Sodium     Status: Abnormal   Collection Time: 03/04/18 12:00 AM  Result Value Ref Range   Sodium 146 (H) 135 - 145 mmol/L    Comment: DELTA CHECK NOTED Performed at [place] [address], [place], [place] [address]   Glucose, capillary      Status: Abnormal   Collection Time: 03/04/18  3:42 AM  Result Value Ref Range   Glucose-Capillary 133 (H) 70 - 99 mg/dL  CBC with Differential/Platelet     Status: Abnormal   Collection Time: 03/04/18  6:00 AM  Result Value Ref Range   WBC 14.2 (H) 4.0 - 10.5 K/uL   RBC 4.43 3.87 - 5.11 MIL/uL   Hemoglobin 13.1 12.0 - 15.0 g/dL   HCT 41.6 36.0 - 46.0 %   MCV 93.9 80.0 - 100.0 fL   MCH 29.6 26.0 - 34.0 pg   MCHC 31.5 30.0 - 36.0 g/dL   RDW 13.5 11.5 - 15.5 %   Platelets 263 150 - 400 K/uL   nRBC 0.3 (H) 0.0 - 0.2 %   Neutrophils Relative % 66 %   Neutro Abs 9.5 (H) 1.7 - 7.7 K/uL   Lymphocytes Relative 16 %   Lymphs Abs 2.3 0.7 - 4.0 K/uL   Monocytes Relative 10 %   Monocytes Absolute 1.5 (H) 0.1 - 1.0 K/uL   Eosinophils Relative 3 %   Eosinophils Absolute 0.4 0.0 - 0.5 K/uL   Basophils Relative 1 %   Basophils Absolute 0.1 0.0 - 0.1 K/uL   Immature Granulocytes 4 %   Abs Immature Granulocytes 0.54 (H) 0.00 - 0.07 K/uL    Comment: Performed at [facility], [address], [place], [place] [address]  Basic metabolic panel     Status: Abnormal   Collection Time: 03/04/18  6:00 AM  Result Value Ref Range   Sodium 153 (H) 135 - 145 mmol/L   Potassium 3.2 (L) 3.5 - 5.1 mmol/L   Chloride 120 (H) 98 - 111 mmol/L   CO2 25 22 - 32 mmol/L   Glucose, Bld 129 (H) 70 - 99 mg/dL   BUN 18 6 - 20 mg/dL   Creatinine, Ser 0.83 0.44 - 1.00 mg/dL   Calcium 9.0 8.9 - 10.3 mg/dL   GFR calc non Af Amer >60 >60 mL/min   GFR calc Af Amer >60 >60 mL/min   Anion gap 8 5 - 15    Comment: Performed at [facility], [place] [address], [place], [place] [address]  Magnesium     Status: None   Collection Time: 03/04/18  6:00 AM  Result Value Ref Range   Magnesium 2.4 1.7 - 2.4 mg/dL    Comment: Performed at [facility] [place] [place] [address] [place], [place] [address]  Phosphorus     Status: Abnormal   Collection Time: 03/04/18  6:00 AM  Result Value Ref Range   Phosphorus 2.2 (L) 2.5  - 4.6 mg/dL    Comment: Performed at [facility] [address] [place], [place] [address]  Glucose, capillary     Status: Abnormal   Collection Time: 03/04/18  8:30 AM  Result Value Ref Range   Glucose-Capillary 143 (H) 70 - 99 mg/dL  Sodium     Status: Abnormal   Collection Time: 03/04/18 12:00 PM  Result Value Ref Range   Sodium 154 (H) 135 - 145 mmol/L  Comment: Performed at [facility], [place] [address] [place], [place] [address]  Glucose, capillary     Status: Abnormal   Collection Time: 03/04/18 12:01 PM  Result Value Ref Range   Glucose-Capillary 140 (H) 70 - 99 mg/dL   Dg Chest Port 1 View  Result Date: 03/04/2018 CLINICAL DATA:  Seizures EXAM: PORTABLE CHEST 1 VIEW COMPARISON:  03/03/2018 FINDINGS: Endotracheal tube, feeding tube, right PICC are stable. Cardiomegaly. Low lung volumes. Bibasilar atelectasis. No pneumothorax. No definitive pleural effusion. IMPRESSION: Stable cardiomegaly and bibasilar atelectasis. Electronically Signed   By: Marybelle Killings M.D.   On: 03/04/2018 08:48   Dg Chest Port 1 View  Result Date: 03/03/2018 CLINICAL DATA:  Shortness of breath.  Oxygen desaturation. EXAM: PORTABLE CHEST 1 VIEW COMPARISON:  Radiograph 03/01/2018 FINDINGS: Endotracheal tube tip 3 cm from the carina. Weighted enteric tube tip below the diaphragm, not included in the field of view. Right upper extremity central venous catheter tip in the distal SVC. Improving right lung base aeration with persistent but improving opacity. Prior pleural effusion is not as well seen. Improved retrocardiac opacity from prior. Unchanged heart size and mediastinal contours. No pulmonary edema or pneumothorax. IMPRESSION: 1. Improving right basilar aeration with persistent but improving opacity. The prior pleural effusion is not as well seen. Improving retrocardiac atelectasis. 2. Otherwise unchanged exam. Endotracheal tube and right central line in place. Weighted enteric tube tip is below  the diaphragm, not included in the field of view. Electronically Signed   By: Keith Rake M.D.   On: 03/03/2018 03:23   CT head: 1. Acute hemorrhage centered in right basal ganglia with extension into right cerebral hemisphere measuring up to 8.4 cm, 79 CC. 2. Extension of hemorrhage into the intraventricular system, greatest in the right lateral ventricle. 3. Mass effect with 10 mm right-to-left midline shift, right-sided uncal herniation, and partial effacement of basilar cisterns. No downward herniation at this time.  CT cervical spine. 1. No acute fracture or dislocation of the cervical spine. 2. 15 mm nodule within left lobe of thyroid gland. Further evaluation with thyroid ultrasound is recommended on a nonemergent basis. 3. Mild spondylosis of the cervical spine.  CT brain repeat 02/27/18 1. Similar to slightly decreased size of large intraparenchymal hematoma centered at the right basal ganglia, estimated volume 71.9 cc on current exam, previously 75 cc. 2. Similar regional mass effect with up to 10 mm of right-to-left midline shift. 3. Associated intraventricular extension with blood primarily in the lateral and third ventricles, similar to previous. 4. No other new intracranial abnormality.  Assessment: 53 y.o. female with acute right basal ganglia hemorrhage etiology likely hypertensive 1. The hemorrhage measures 75 cc=-slightly improved There is extension into the right cerebral hemisphere and ventricles as well as mass effect with 10 mm right to left midline shift, right sided uncal herniation and partial effacement of the basilar cisterns.        Plan:continue hypertonic saline with serum sodium goal 150-155. Strict blood pressure control with SBP goal below 160 Maintain normothermia, euglycemia and euvolemic..repeat CT scan of the head tomorrow morning..Continue Naficillin Long d/w her mother at bedside and answered questions about prognosis. D/w  Fayne Mediate This  patient is critically ill and at significant risk of neurological worsening, death and care requires constant monitoring of vital signs, hemodynamics,respiratory and cardiac monitoring, extensive review of multiple databases, frequent neurological assessment, discussion with family, other specialists and medical decision making of high complexity.I have made any additions or  clarifications directly to the above note.This critical care time does not reflect procedure time, or teaching time or supervisory time of PA/NP/Med Resident etc but could involve care discussion time.  I spent 30 minutes of neurocritical care time  in the care of  this patient.     Antony Contras, MD Medical Director [facility] Pager: [phone] 03/04/2018 3:12 PM  03/04/2018, 3:12 PM

## 2018-03-04 NOTE — Progress Notes (Signed)
NAME:  Jacqueline Pratt, MRN:  [id number], DOB:  1965/07/04, LOS: 6 ADMISSION DATE:  03/02/2018, CONSULTATION DATE:  02/25/2018 REFERRING MD:  Kerney Elbe, CHIEF COMPLAINT:  Found down   Brief History   60F found down, 8 cm basal ganglia hemorrhage. Intubated in ED   Past Medical History  Alcohol abuse, Hypertension & Seizures  Significant Hospital Events   1/04 Admit with AMS 1/6 remains on Cleviprex. Agitated at times. No sig change  1/7: Weaning.  Neuro exam the same.  Blood pressure controlled.  Sputum sent for right lower lobe infiltrate and mild leukocytosis 1/8: CT head stable.  Growing staph in sputum empirically starting antibiotics.  Working on Careers adviser. 03/03/2026 Depakote and Xanax to pharmaceutical regimen due to history of anxiolytic use and EtOH abuse. 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  No significant change in large intraparenchymal hematoma in the right basal ganglia slight increase in localized vasogenic edema ongoing mass-effect right to left shift CT brain 1/7:no sig change  Micro Data:   Respiratory culture 1/7: SA>>> Antimicrobials:  vanc 1/8 >>> 03/03/2018 Nafcillin 03/03/2018>>   Subjective:  03/04/2018 despite increased sedation antibiotics not amenable to extubation today. Objective   Blood pressure (!) 133/53, pulse 81, temperature (!) 97.2 F (36.2 C), temperature source Axillary, resp. rate 17, height _0  (1.575 m), weight 90.3 kg, SpO2 100 %.    Vent Mode: CPAP;PSV FiO2 (%):  [40 %] 40 % Set Rate:  [18 bmp] 18 bmp Vt Set:  [430 mL] 430 mL PEEP:  [5 cmH20] 5 cmH20 Pressure Support:  [5 cmH20] 5 cmH20 Plateau Pressure:  [21 cmH20-32 cmH20] 32  cmH20   Intake/Output Summary (Last 24 hours) at 03/04/2018 1027 Last data filed at 03/04/2018 0900 Gross per 24 hour  Intake 3732.7 ml  Output 2801 ml  Net 931.7 ml   Filed Weights   03/02/18 0500 03/03/18 0400 03/04/18 0500  Weight: 89.5 kg 90.7 kg 90.3 kg    Examination: General: Middle-aged female who is agitated or sedated HEENT: Endotracheal tube in place feeding tube in place Neuro: Moves right hand in an agitated  manner CV: Sounds are regular PULM: even/non-labored, lungs bilaterally with Tory wheeze VO:JJKK, non-tender, bsx4 active  Extremities: warm/dry, 1+ edema  Skin: no rashes or lesions      ag metabolic acidosis Renal failure (AKI resolved 1/5) rhabdo  Leukocytosis  Assessment & Plan:   53 year old female history of alcohol abuse, hypertension, and seizures who was brought in after being found down with unknown last normal. Found to have 8.4 cm right basal ganglia hemorrhage.  Noted to have alcohol anxiety issues home setting.  03/04/2018 failed to meet criteria for extubation.  Right basal ganglia hemorrhage Malignant hypertension -family declined surgery  P: Goals per neurology    Acute Respiratory Insufficiency in setting of ICH   -intubation for airway protection Portable chest x-ray personally reviewed: p: Daily spontaneous breathing trials as tolerated Agitation makes extubation this time unlikely She will need at least 24 more hours of antibiotics and sedation to make her more amenable to extubation    RLL Staph pneumonia  Follow-up sputum cultures P: From vancomycin to nafcillin on 1 nine 2020-day 1 of nafcillin  for sensitive staph 03/03/2018 procalcitonin was less than 0.10  Fluid and electrolyte imbalance: Hypernatremia, Hyperkalemia  (therapeutic)->protocol stopped 1/6 after Na was > 160 sodium currently 146 03/04/2018 potassium 2.6 P:  03/04/2018 replete potassium and phosphorus 03/04/2018 be met ordered for 1500 hrs.  Alcohol  abuse P: Continue vitamin therapy She uses Xanax this time she has been restarted on Xanax and Depakote to help with her agitation 03/04/2018 remains encephalopathic Anemia  -no evidence of bleeding currently  P:  Continue to monitor CBC  Best practice:  Diet: NPO, currently on tube feedings Pain/Anxiety/Delirium protocol (if indicated): fentanyl, note she has history of anxiety is on as needed Xanax at home. VAP protocol (if indicated): ordered DVT prophylaxis: contraindicated with bleed, scd's GI prophylaxis: PPI Glucose control: sliding scale insulin Mobility: contraindicated at this time Code Status: full code Family Communication: 03/04/2018 mother updated at bedside Disposition: ICU under neurology care; 03/05/2027 not ready for extubation at this time.   Critical care time:  30 minutes     Richardson Landry Yonatan Guitron ACNP Maryanna Shape PCCM Pager [phone] till 1 pm If no answer page [phone] 03/04/2018, 10:27 AM

## 2018-03-04 NOTE — Progress Notes (Addendum)
Nutrition Follow-up  DOCUMENTATION CODES:   Obesity unspecified  INTERVENTION:   -Vital High Protein @ 50 ml/hr (1200 ml/day) via OG tube -MVI daily  Provides: 1200 kcal, 105 grams protein, and 1003 ml free water. Meets 100% of needs.   NUTRITION DIAGNOSIS:   Inadequate oral intake related to inability to eat as evidenced by NPO status.  Ongoing  GOAL:   Provide needs based on ASPEN/SCCM guidelines  Met  MONITOR:   Vent status, TF tolerance  REASON FOR ASSESSMENT:   Consult, Ventilator Enteral/tube feeding initiation and management  ASSESSMENT:   Pt with PMH of ETOH abuse, uncontrolled HTN, Sz, anxiety, and depression now admitted 1/4 after being found down with an 8 cm basal ganglia hemorrhage.    1/8- gastric cortrak placed   Pt discussed during ICU rounds and with RN. CXR shows RRL PNA. No plans for extubation as pt remains agitated/encephalopathic. Weaned off cleviprex completely today. Remains on 3% hypertonic saline. Tolerating TF well, will adjust accordingly. No BM since admit, bowel regimen in place.   Weight noted to decrease from 201 lb to 199 lb today.   Patient is currently intubated on ventilator support MV: 9.1 L/min Temp (24hrs), Avg:99 F (37.2 C), Min:97.2 F (36.2 C), Max:100.4 F (38 C) BP: 180/66 MAP: 96  I/O: + 1228 ml since admit UOP: 951 ml x 24 hrs   Medications reviewed and include: folic acid, MVI with minerals, senokot, thiamine, potassium phosphate Labs reviewed: Na 154 (H) K 3.2 (L) Phosphorus 2.2 (L)  Diet Order:   Diet Order            Diet NPO time specified  Diet effective now              EDUCATION NEEDS:   No education needs have been identified at this time  Skin:  Skin Assessment: Reviewed RN Assessment  Last BM:  03/20/2018- constipation   Height:   Ht Readings from Last 1 Encounters:  02/25/2018 '5\' 2"'  (1.575 m)    Weight:   Wt Readings from Last 1 Encounters:  03/04/18 90.3 kg    Ideal Body  Weight:  50 kg  BMI:  Body mass index is 36.41 kg/m.  Estimated Nutritional Needs:   Kcal:  8185-9093  Protein:  >100 grams  Fluid:  >1.5 L/day   Mariana Single RD, LDN Clinical Nutrition Pager # - [phone]

## 2018-03-05 ENCOUNTER — Inpatient Hospital Stay (HOSPITAL_COMMUNITY): Payer: Managed Care, Other (non HMO)

## 2018-03-05 DIAGNOSIS — Z87898 Personal history of other specified conditions: Secondary | ICD-10-CM

## 2018-03-05 DIAGNOSIS — G936 Cerebral edema: Secondary | ICD-10-CM

## 2018-03-05 DIAGNOSIS — I161 Hypertensive emergency: Secondary | ICD-10-CM

## 2018-03-05 DIAGNOSIS — J189 Pneumonia, unspecified organism: Secondary | ICD-10-CM

## 2018-03-05 DIAGNOSIS — E782 Mixed hyperlipidemia: Secondary | ICD-10-CM

## 2018-03-05 LAB — GLUCOSE, CAPILLARY
GLUCOSE-CAPILLARY: 121 mg/dL — AB (ref 70–99)
GLUCOSE-CAPILLARY: 122 mg/dL — AB (ref 70–99)
Glucose-Capillary: 128 mg/dL — ABNORMAL HIGH (ref 70–99)
Glucose-Capillary: 133 mg/dL — ABNORMAL HIGH (ref 70–99)
Glucose-Capillary: 135 mg/dL — ABNORMAL HIGH (ref 70–99)
Glucose-Capillary: 135 mg/dL — ABNORMAL HIGH (ref 70–99)

## 2018-03-05 LAB — BASIC METABOLIC PANEL WITH GFR
Anion gap: 11 (ref 5–15)
BUN: 20 mg/dL (ref 6–20)
CO2: 25 mmol/L (ref 22–32)
Calcium: 9.2 mg/dL (ref 8.9–10.3)
Chloride: 121 mmol/L — ABNORMAL HIGH (ref 98–111)
Creatinine, Ser: 0.87 mg/dL (ref 0.44–1.00)
GFR calc Af Amer: >60 mL/min (ref >60)
GFR calc non Af Amer: >60 mL/min (ref >60)
Glucose, Bld: 128 mg/dL — ABNORMAL HIGH (ref 70–99)
Potassium: 3.1 mmol/L — ABNORMAL LOW (ref 3.5–5.1)
Sodium: 157 mmol/L — ABNORMAL HIGH (ref 135–145)

## 2018-03-05 LAB — CBC WITH DIFFERENTIAL/PLATELET
Abs Immature Granulocytes: 1.13 K/uL — ABNORMAL HIGH (ref 0.00–0.07)
Basophils Absolute: 0.2 K/uL — ABNORMAL HIGH (ref 0.0–0.1)
Basophils Relative: 2 %
Eosinophils Absolute: 0.3 K/uL (ref 0.0–0.5)
Eosinophils Relative: 2 %
HCT: 40.8 % (ref 36.0–46.0)
Hemoglobin: 12.8 g/dL (ref 12.0–15.0)
Immature Granulocytes: 9 %
Lymphocytes Relative: 17 %
Lymphs Abs: 2.1 K/uL (ref 0.7–4.0)
MCH: 30.2 pg (ref 26.0–34.0)
MCHC: 31.4 g/dL (ref 30.0–36.0)
MCV: 96.2 fL (ref 80.0–100.0)
Monocytes Absolute: 1.3 K/uL — ABNORMAL HIGH (ref 0.1–1.0)
Monocytes Relative: 11 %
Neutro Abs: 7.6 K/uL (ref 1.7–7.7)
Neutrophils Relative %: 59 %
Platelets: 261 K/uL (ref 150–400)
RBC: 4.24 MIL/uL (ref 3.87–5.11)
RDW: 13.7 % (ref 11.5–15.5)
WBC: 12.7 K/uL — ABNORMAL HIGH (ref 4.0–10.5)
nRBC: 0.4 % — ABNORMAL HIGH (ref 0.0–0.2)

## 2018-03-05 LAB — HEMOGLOBIN A1C
Hgb A1c MFr Bld: 5.8 % — ABNORMAL HIGH (ref 4.8–5.6)
Mean Plasma Glucose: 119.76 mg/dL

## 2018-03-05 LAB — LIPID PANEL
Cholesterol: 231 mg/dL — ABNORMAL HIGH (ref 0–200)
HDL: 31 mg/dL — ABNORMAL LOW (ref >40)
LDL Cholesterol: 171 mg/dL — ABNORMAL HIGH (ref 0–99)
Total CHOL/HDL Ratio: 7.5 RATIO
Triglycerides: 146 mg/dL (ref <150)
VLDL: 29 mg/dL (ref 0–40)

## 2018-03-05 LAB — SODIUM
Sodium: 159 mmol/L — ABNORMAL HIGH (ref 135–145)
Sodium: 155 mmol/L — ABNORMAL HIGH (ref 135–145)
Sodium: 155 mmol/L — ABNORMAL HIGH (ref 135–145)

## 2018-03-05 LAB — MAGNESIUM: Magnesium: 2.4 mg/dL (ref 1.7–2.4)

## 2018-03-05 LAB — PHOSPHORUS: Phosphorus: 3 mg/dL (ref 2.5–4.6)

## 2018-03-05 MED ORDER — POLYETHYLENE GLYCOL 3350 17 G PO PACK
17.0000 g | PACK | Freq: Every day | ORAL | Status: DC
  Administered 2018-03-05 – 2018-03-06 (×2): 17 g via ORAL
  Filled 2018-03-05 (×2): qty 1

## 2018-03-05 MED ORDER — DEXMEDETOMIDINE HCL IN NACL 200 MCG/50ML IV SOLN
0.0000 ug/kg/h | INTRAVENOUS | Status: DC
  Administered 2018-03-05 (×2): 0.5 ug/kg/h via INTRAVENOUS
  Administered 2018-03-05: 0.4 ug/kg/h via INTRAVENOUS
  Administered 2018-03-06: 0.5 ug/kg/h via INTRAVENOUS
  Administered 2018-03-06: 0.4 ug/kg/h via INTRAVENOUS
  Administered 2018-03-06: 0.5 ug/kg/h via INTRAVENOUS
  Administered 2018-03-06: 0.4 ug/kg/h via INTRAVENOUS
  Administered 2018-03-07 (×4): 0.6 ug/kg/h via INTRAVENOUS
  Administered 2018-03-07: 0.4 ug/kg/h via INTRAVENOUS
  Administered 2018-03-08: 0.6 ug/kg/h via INTRAVENOUS
  Administered 2018-03-08 (×2): 0.4 ug/kg/h via INTRAVENOUS
  Filled 2018-03-05 (×14): qty 50

## 2018-03-05 MED ORDER — EZETIMIBE 10 MG PO TABS
10.0000 mg | ORAL_TABLET | Freq: Every day | ORAL | Status: DC
  Administered 2018-03-05 – 2018-03-07 (×3): 10 mg via ORAL
  Filled 2018-03-05 (×3): qty 1

## 2018-03-05 MED ORDER — POTASSIUM CHLORIDE 20 MEQ/15ML (10%) PO SOLN
40.0000 meq | ORAL | Status: AC
  Administered 2018-03-05 (×2): 40 meq
  Filled 2018-03-05 (×2): qty 30

## 2018-03-05 MED ORDER — FENTANYL CITRATE (PF) 100 MCG/2ML IJ SOLN: 100.0000 ug | INTRAMUSCULAR | Status: DC | PRN

## 2018-03-05 NOTE — Progress Notes (Signed)
210 cc Fentanyl gtt wasted- witnessed by Monico Hoar, RN

## 2018-03-05 NOTE — Progress Notes (Signed)
TRansported pt to CT and back with RN without any complications.

## 2018-03-05 NOTE — Progress Notes (Signed)
NAME:  Jacqueline Pratt, MRN:  [id number], DOB:  [birth year], LOS: 7 ADMISSION DATE:  02/25/2018, CONSULTATION DATE:  03/16/2018 REFERRING MD:  Kerney Elbe, CHIEF COMPLAINT:  Found down   Brief History   26F found down, 8 cm basal ganglia hemorrhage. Intubated in ED   Past Medical History  Alcohol abuse, Hypertension & Seizures  Significant Hospital Events   1/04 Admit with AMS 1/6 remains on Cleviprex. Agitated at times. No sig change  1/7: Weaning.  Neuro exam the same.  Blood pressure controlled.  Sputum sent for right lower lobe infiltrate and mild leukocytosis 1/8: CT head stable.  Growing staph in sputum empirically starting antibiotics.  Working on Careers adviser. 03/03/2026 Depakote and Xanax to pharmaceutical regimen due to history of anxiolytic use and EtOH abuse. Nafcillin started for MSSA PNA 1/10: either agitated or sedated. Not able to protect airway.  1/11 still w/ intermittent fevers. Tolerating PSV but not ready to extubate adding precedex to see if will help w/ MS  Consults:  PCCM  Procedures:  ETT 1/4 >>  Significant Diagnostic Tests:  1/04 CT Head >> 8.4 cm hemorrhage in right basal ganglia with right sided uncal hernation 1/6: CT head:Large right cerebral hemorrhage, multiple intracranial aneurysms in left ICA.  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  No significant change in large intraparenchymal hematoma in the right basal ganglia slight increase in localized vasogenic edema ongoing mass-effect right to left shift CT brain 1/7:no sig change  Micro Data:   Respiratory culture 1/7: SA Antimicrobials:  vanc 1/8 >>> 03/03/2018 Nafcillin 03/03/2018>>   Subjective:  No sig improvement; still lethargic. Agitated at times.  Objective   Blood pressure (Abnormal) 148/58, pulse 79, temperature 99.7 F (37.6 C), temperature source Axillary, resp. rate (Abnormal) 25, height 5\' 2"  (1.575 m), weight 90 kg, SpO2 98 %.    Vent  Mode: CPAP;PSV FiO2 (%):  [40 %] 40 % Set Rate:  [18 bmp] 18 bmp Vt Set:  [430 mL] 430 mL PEEP:  [5 cmH20] 5 cmH20 Pressure Support:  [5 cmH20] 5 cmH20 Plateau Pressure:  [15 cmH20] 15 cmH20   Intake/Output Summary (Last 24 hours) at 03/05/2018 0854 Last data filed at 03/05/2018 0700 Gross per 24 hour  Intake 3221.19 ml  Output 2300 ml  Net 921.19 ml   Filed Weights   03/03/18 0400 03/04/18 0500 03/05/18 0500  Weight: 90.7 kg 90.3 kg 90 kg    Examination: General 53 year old wf, sedated on fent. Not following commands.  HENT facial swelling. Possibly increased tongue swelling. Orally intubated. MMM Pulm diffuse rhonchi equal chest rise on vent. Tolerating PSV of 10 cmH2O Card RRR w/out MRG  abd tol TFs + bowel sounds no OM Neuro Unchanged. Left sided hemiparesis. Aggressively bangs left arm w/ stimulation  Ext generalized anasarca    ag metabolic acidosis Renal failure (AKI resolved 1/5) rhabdo  Leukocytosis  Assessment & Plan:   53 year old female history of alcohol abuse, hypertension, and seizures who was brought in after being found down with unknown last normal. Found to have 8.4 cm right basal ganglia hemorrhage.  Noted to have alcohol anxiety issues home setting.  Right basal ganglia hemorrhage Malignant hypertension -family declined surgery  P: SBP goal <160 Hypertonic saline (3%) protocol for Na goal 150-155 Serial chemistries Serial neuro checks EEG today Anti-convulsants per neuro   H/o ETOH w/ toxic and  metabolic Encephalopathy complicating her picture s/p ICH P:  xanax (this was  a chronic med) depakote added for agitation  Cont thiamine and folate   Acute Respiratory Insufficiency in setting of ICH  C/b RLL staph Pneumonia  PCXR: no sig change in aeration. Right now looks a little better but has worsening atx on left. Tubes in good position Not able to extubate d/t MS WBC trending down still spiking intermittent fever  P: Day 4/7 antibiotics  for staph PNA (currently on Naf) Cont PSV as tolerated.  VAP bundle  RASS goal 0 Change fent to precedex Will likely need trach or one way extubation    Fluid and electrolyte imbalance: Hypernatremia (therapeutic) Hypokalemia  P: Therapeutic Hypernatremia per stroke team Replace K Serial chemistries   ? Tongue swelling vs just anasarca P: F/u in am May need to reconsider Naf Anemia  -no evidence of bleeding currently  P: Trend cbc  Best practice:  Diet: NPO, currently on tube feedings Pain/Anxiety/Delirium protocol (if indicated): fentanyl, note she has history of anxiety is on as needed Xanax at home. VAP protocol (if indicated): ordered DVT prophylaxis: contraindicated with bleed, scd's GI prophylaxis: PPI Glucose control: sliding scale insulin Mobility: contraindicated at this time Code Status: full code Family Communication: 03/03/2018 mother updated at bedside Disposition: remains critically ill requiring titration of ventilator support for respiratory failure. Frequent neuro assessments and titration of antihypertensives. Not ready to extubate d/t MS   Critical care time:  52min

## 2018-03-05 NOTE — Progress Notes (Signed)
STROKE TEAM PROGRESS NOTE     SUBJECTIVE (INTERVAL HISTORY) Her mother is at the bedside.  She remains intubated and not following commands.    OBJECTIVE Vitals:   03/05/18 0800 03/05/18 0809 03/05/18 0900 03/05/18 1000  BP: (!) 148/58 (!) 148/58 (!) 145/67 (!) 153/58  Pulse: 62 79 64 63  Resp: (!) 22 (!) 25 18 16   Temp: 99.7 F (37.6 C)     TempSrc: Axillary     SpO2: 100% 98% 99% 100%  Weight:      Height:        CBC:  Recent Labs  Lab 03/04/18 0600 03/05/18 0510  WBC 14.2* 12.7*  NEUTROABS 9.5* 7.6  HGB 13.1 12.8  HCT 41.6 40.8  MCV 93.9 96.2  PLT 263 627    Basic Metabolic Panel:  Recent Labs  Lab 03/04/18 0600  03/04/18 1527 03/05/18 0017 03/05/18 0510  NA 153*   < > 156* 159* 157*  K 3.2*  --  3.0*  --  3.1*  CL 120*  --  121*  --  121*  CO2 25  --  27  --  25  GLUCOSE 129*  --  128*  --  128*  BUN 18  --  20  --  20  CREATININE 0.83  --  0.77  --  0.87  CALCIUM 9.0  --  8.7*  --  9.2  MG 2.4  --   --   --  2.4  PHOS 2.2*  --   --   --  3.0   < > = values in this interval not displayed.    Lipid Panel:     Component Value Date/Time   CHOL 231 (H) 03/05/2018 0510   TRIG 146 03/05/2018 0510   HDL 31 (L) 03/05/2018 0510   CHOLHDL 7.5 03/05/2018 0510   VLDL 29 03/05/2018 0510   LDLCALC 171 (H) 03/05/2018 0510   HgbA1c:  Lab Results  Component Value Date   HGBA1C 5.8 (H) 03/05/2018   Urine Drug Screen:     Component Value Date/Time   LABOPIA NONE DETECTED 03/21/2018 2129   COCAINSCRNUR NONE DETECTED 03/19/2018 2129   LABBENZ NONE DETECTED 03/06/2018 2129   AMPHETMU NONE DETECTED 03/17/2018 2129   THCU NONE DETECTED 03/19/2018 2129   LABBARB NONE DETECTED 03/22/2018 2129    Alcohol Level     Component Value Date/Time   ETH <10 02/24/2018 1716    IMAGING  Ct Head Wo Contrast 03/05/2018 IMPRESSION:  1. Stable hemorrhage in the right basal ganglia measuring up to 7.9 cm, 73 cc, when measured in a similar fashion.  2. Stable  associated edema and mass effect with 10 mm right-to-left midline shift.  3. Stable small volume intraventricular hemorrhage. Stable ventricle size.  4. No new acute intracranial abnormality identified.    Dg Chest Port 1 View 03/05/2018 IMPRESSION:  Stable support apparatus. Stable bibasilar opacities as described above, left greater than right.    Dg Chest Port 1 View 03/04/2018 IMPRESSION:  Stable cardiomegaly and bibasilar atelectasis.   CT head: 1. Acute hemorrhage centered in right basal ganglia with extension into right cerebral hemisphere measuring up to 8.4 cm, 79 CC. 2. Extension of hemorrhage into the intraventricular system, greatest in the right lateral ventricle. 3. Mass effect with 10 mm right-to-left midline shift, right-sided uncal herniation, and partial effacement of basilar cisterns. No downward herniation at this time.  CT cervical spine. 1. No acute fracture or dislocation of the cervical spine.  2. 15 mm nodule within left lobe of thyroid gland. Further evaluation with thyroid ultrasound is recommended on a non emergent basis. 3. Mild spondylosis of the cervical spine.  CT brain repeat 02/27/18 1. Similar to slightly decreased size of large intraparenchymal hematoma centered at the right basal ganglia, estimated volume 71.9 cc on current exam, previously 75 cc. 2. Similar regional mass effect with up to 10 mm of right-to-left midline shift. 3. Associated intraventricular extension with blood primarily in the lateral and third ventricles, similar to previous. 4. No other new intracranial abnormality.   Transthoracic Echocardiogram  00/00/00 Pending      PHYSICAL EXAM  Temp:  [99.1 F (37.3 C)-101.2 F (38.4 C)] 99.1 F (37.3 C) (01/11 1200) Pulse Rate:  [51-81] 64 (01/11 1200) Resp:  [16-27] 18 (01/11 1200) BP: (143-188)/(58-94) 146/58 (01/11 1200) SpO2:  [98 %-100 %] 99 % (01/11 1200) FiO2 (%):  [40 %] 40 % (01/11 1131) Weight:  [90  kg] 90 kg (01/11 0500)  General - Well nourished, well developed, intubated on sedation.  Ophthalmologic - fundi not visualized due to noncooperation.  Cardiovascular - Regular rate and rhythm  Neuro - intubated on sedation, eyes closed, not following commands. With forced eye opening, eyes in right gaze position, not blinking to visual threat, doll's eyes slight rolling, not tracking, PERRL. Corneal reflex present, gag and cough present. Breathing over the vent.  Facial symmetry not able to test due to ET tube.  Tongue midline in mouth. On pain stimulation, RUE 3/5 and RLE withdraw to pain 2/5, however, LUE flaccid and LLE slight withdraw. DTR 1+ and no babinski. Sensation, coordination and gait not tested.   ASSESSMENT/PLAN Ms. ZOANNE NEWILL is a 53 y.o. female with history of seizures, migraines, Htn, depression and anxiety brought to [facility] after being found unresponsive . She did not receive IV t-PA due to hemorrhage.  ICH - Acute right basal ganglia hemorrhage etiology likely hypertensive  CT head x 3 -  Stable large right BG ICH. Stable associated edema and mass effect with 10 mm right-to-left midline shift.   CTA Head - Multiple intracranial aneurysms: 3 mm proximal cavernous left ICA aneurysm, 5 mm distal cavernous left ICA aneurysm, 3 mm supraclinoid left ICA aneurysm, 4 mm proximal cavernous right ICA aneurysm, 4 mm supraclinoid right ICA aneurysm, and 3 mm basilar tip aneurysm.  Carotid Doppler - not performed  2D Echo - pending  LDL - 171  HgbA1c - 5.8  UDS - negative  VTE prophylaxis - Lovenox  Diet - NPO  No antithrombotic prior to admission, now on No antithrombotic  Ongoing aggressive stroke risk factor management  Therapy recommendations:  pending  Disposition:  Pending  Cerebral edema  Serial CT showed a stable midline shift 10 mm  Was on 3% saline, now off  Sodium 156-157  On Precedex  Sodium goal 1 50-1 55  Sodium check every 6  hours  Respiratory failure   Intubated for airway protection  On ventilation  CCM on board  On Precedex  May need tracheostomy  However, I discussed with mom at bedside, mom refuse tracheostomy at this time.  I did encourage her to discuss with other family members regarding this issue.  Pneumonia  Sputum culture positive for staph aureus  Temperature 100.4-101.2  Leukocytosis 14.2-12.7  On nafcillin  CXR stable bibasilar opacities, left more than right  UA pending  Hypertensive emergency  Stable   Now off Cleviprex  BP goal less than 160  On p.o. Norvasc . Long-term BP goal normotensive  Hyperlipidemia  Lipid lowering medication PTA:  none  LDL 171, goal < 70  Current lipid lowering medication: none  Hx of statin intolerance  On Zetia  Other Stroke Risk Factors  Former cigarette smoker - quit  ETOH use, advised to drink no more than 1 alcoholic beverage per day.  Obesity, Body mass index is 36.29 kg/m., recommend weight loss, diet and exercise as appropriate   Migraines  Other Active Problems  Seizure history - on Lamictal  15 mm nodule within left lobe of thyroid gland. Further evaluation with thyroid ultrasound is recommended on a non emergent basis.  Hospital day # 7   This patient is critically ill and at significant risk of neurological worsening, death and care requires constant monitoring of vital signs, hemodynamics,respiratory and cardiac monitoring, extensive review of multiple databases, frequent neurological assessment, discussion with family, other specialists and medical decision making of high complexity.I have made any additions or clarifications directly to the above note. I spent 40 minutes of neurocritical care time  in the care of  this patient. I had long discussion with pt mom at bedside, updated pt current condition, treatment plan and potential prognosis. She expressed understanding and appreciation.  She currently  refuse tracheostomy but I encouraged her to discuss with other family members.  Rosalin Hawking, MD PhD Stroke Neurology 03/05/2018 3:17 PM  To contact Stroke Continuity provider, please refer to [email or web address] After hours, contact General Neurology

## 2018-03-06 ENCOUNTER — Inpatient Hospital Stay (HOSPITAL_COMMUNITY): Payer: Managed Care, Other (non HMO)

## 2018-03-06 DIAGNOSIS — J181 Lobar pneumonia, unspecified organism: Secondary | ICD-10-CM

## 2018-03-06 DIAGNOSIS — I361 Nonrheumatic tricuspid (valve) insufficiency: Secondary | ICD-10-CM

## 2018-03-06 DIAGNOSIS — R7989 Other specified abnormal findings of blood chemistry: Secondary | ICD-10-CM

## 2018-03-06 DIAGNOSIS — I10 Essential (primary) hypertension: Secondary | ICD-10-CM

## 2018-03-06 DIAGNOSIS — E785 Hyperlipidemia, unspecified: Secondary | ICD-10-CM

## 2018-03-06 DIAGNOSIS — I351 Nonrheumatic aortic (valve) insufficiency: Secondary | ICD-10-CM

## 2018-03-06 LAB — CBC
HCT: 40.1 % (ref 36.0–46.0)
HEMOGLOBIN: 12.1 g/dL (ref 12.0–15.0)
MCH: 29.1 pg (ref 26.0–34.0)
MCHC: 30.2 g/dL (ref 30.0–36.0)
MCV: 96.4 fL (ref 80.0–100.0)
NRBC: 0.2 % (ref 0.0–0.2)
Platelets: 264 10*3/uL (ref 150–400)
RBC: 4.16 MIL/uL (ref 3.87–5.11)
RDW: 13.6 % (ref 11.5–15.5)
WBC: 12.9 10*3/uL — ABNORMAL HIGH (ref 4.0–10.5)

## 2018-03-06 LAB — COMPREHENSIVE METABOLIC PANEL
ALT: 60 U/L — ABNORMAL HIGH (ref 0–44)
AST: 32 U/L (ref 15–41)
Albumin: 2.7 g/dL — ABNORMAL LOW (ref 3.5–5.0)
Alkaline Phosphatase: 86 U/L (ref 38–126)
Anion gap: 10 (ref 5–15)
BUN: 35 mg/dL — ABNORMAL HIGH (ref 6–20)
CO2: 24 mmol/L (ref 22–32)
Calcium: 8.9 mg/dL (ref 8.9–10.3)
Chloride: 119 mmol/L — ABNORMAL HIGH (ref 98–111)
Creatinine, Ser: 1.28 mg/dL — ABNORMAL HIGH (ref 0.44–1.00)
GFR calc non Af Amer: 48 mL/min — ABNORMAL LOW (ref >60)
GFR, EST AFRICAN AMERICAN: 55 mL/min — AB (ref >60)
Glucose, Bld: 189 mg/dL — ABNORMAL HIGH (ref 70–99)
Potassium: 3.1 mmol/L — ABNORMAL LOW (ref 3.5–5.1)
Sodium: 153 mmol/L — ABNORMAL HIGH (ref 135–145)
Total Bilirubin: 1.4 mg/dL — ABNORMAL HIGH (ref 0.3–1.2)
Total Protein: 6.5 g/dL (ref 6.5–8.1)

## 2018-03-06 LAB — SODIUM
SODIUM: 155 mmol/L — AB (ref 135–145)
Sodium: 155 mmol/L — ABNORMAL HIGH (ref 135–145)
Sodium: 155 mmol/L — ABNORMAL HIGH (ref 135–145)

## 2018-03-06 LAB — GLUCOSE, CAPILLARY
GLUCOSE-CAPILLARY: 141 mg/dL — AB (ref 70–99)
Glucose-Capillary: 125 mg/dL — ABNORMAL HIGH (ref 70–99)
Glucose-Capillary: 139 mg/dL — ABNORMAL HIGH (ref 70–99)
Glucose-Capillary: 141 mg/dL — ABNORMAL HIGH (ref 70–99)
Glucose-Capillary: 146 mg/dL — ABNORMAL HIGH (ref 70–99)
Glucose-Capillary: 158 mg/dL — ABNORMAL HIGH (ref 70–99)

## 2018-03-06 LAB — ECHOCARDIOGRAM COMPLETE
Height: 62 in
Weight: 3068.8 oz

## 2018-03-06 MED ORDER — SODIUM CHLORIDE 0.9 % IV SOLN: INTRAVENOUS | Status: DC | PRN

## 2018-03-06 MED ORDER — SODIUM CHLORIDE 0.9 % IV SOLN
INTRAVENOUS | Status: DC
  Administered 2018-03-06 – 2018-03-07 (×2): via INTRAVENOUS

## 2018-03-06 MED ORDER — POTASSIUM CHLORIDE 20 MEQ/15ML (10%) PO SOLN
40.0000 meq | ORAL | Status: AC
  Administered 2018-03-06 (×2): 40 meq
  Filled 2018-03-06 (×2): qty 30

## 2018-03-06 NOTE — Progress Notes (Signed)
NAME:  Jacqueline Pratt, MRN:  [id number], DOB:  07-26-1965, LOS: 8 ADMISSION DATE:  03/15/2018, CONSULTATION DATE:  03/23/2018 REFERRING MD:  Kerney Elbe, CHIEF COMPLAINT:  Found down   Brief History   53F found down, 8 cm basal ganglia hemorrhage. Intubated in ED   Past Medical History  Alcohol abuse, Hypertension & Seizures  Significant Hospital Events   1/04 Admit with AMS 1/6 remains on Cleviprex. Agitated at times. No sig change  1/7: Weaning.  Neuro exam the same.  Blood pressure controlled.  Sputum sent for right lower lobe infiltrate and mild leukocytosis 1/8: CT head stable.  Growing staph in sputum empirically starting antibiotics.  Working on Careers adviser. 1/9/ Depakote and Xanax to pharmaceutical regimen due to history of anxiolytic use and EtOH abuse. Nafcillin started for MSSA PNA 1/10: either agitated or sedated. Not able to protect airway.  1/11 still w/ intermittent fevers. Tolerating PSV but not ready to extubate adding precedex to see if will help w/ MS  1/12: Looks the same, CT scan from the 11th unchanged.  Goals of care discussion with family.  Patient does not want tracheostomy, requests DO NOT RESUSCITATE status.  Planning for one-way extubation on 1/14 given another 48 hours of maximizing medical issues Consults:  PCCM  Procedures:  ETT 1/4 >>  Significant Diagnostic Tests:  1/04 CT Head >> 8.4 cm hemorrhage in right basal ganglia with right sided uncal hernation 1/6: CT head:Large right cerebral hemorrhage, multiple intracranial aneurysms in left ICA.  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  No significant change in large intraparenchymal hematoma in the right basal ganglia slight increase in localized vasogenic edema ongoing mass-effect right to left shift CT brain 1/7:no sig change  CT brain 1/11: Stable hemorrhage in the right basal ganglia stable associated edema with mass-effect and right to left midline shift  stable intraventricular hemorrhage Micro Data:   Respiratory culture 1/7: SA Antimicrobials:  vanc 1/8 >>> 03/03/2018 Nafcillin 03/03/2018>>   Subjective:  No sig change .  Objective   Blood pressure (Abnormal) 110/48, pulse (Abnormal) 54, temperature 99.9 F (37.7 C), temperature source Axillary, resp. rate (Abnormal) 24, height 5\' 2"  (1.575 m), weight 87 kg, SpO2 98 %.    Vent Mode: PRVC FiO2 (%):  [40 %-100 %] 40 % Set Rate:  [18 bmp] 18 bmp Vt Set:  [430 mL] 430 mL PEEP:  [5 cmH20] 5 cmH20 Pressure Support:  [10 cmH20] 10 cmH20 Plateau Pressure:  [16 cmH20-19 cmH20] 16 cmH20   Intake/Output Summary (Last 24 hours) at 03/06/2018 5397 Last data filed at 03/06/2018 0800 Gross per 24 hour  Intake 1875.98 ml  Output 2276 ml  Net -400.02 ml   Filed Weights   03/04/18 0500 03/05/18 0500 03/06/18 0300  Weight: 90.3 kg 90 kg 87 kg    Examination: General: Remains ventilated, intermittently agitated, placed back on full support this morning HEENT normocephalic atraumatic, she has bitten her tongue, she has some associated swelling from this.  Neck is short, Lucas membranes otherwise moist pupils equal reactive Pulmonary: Scattered rhonchi, back on full set ventilator support, did have accessory use on pressure support ventilation Cardiac: Tachycardic regular rate and rhythm without murmur rub or gallop Abdomen: Soft nontender no organomegaly Extremities: Dependent edema otherwise brisk capillary refill strong pulses Neuro: Agitated with stimulation, left side remains flaccid, right side with semi-purposeful movement primarily agitation    ag metabolic acidosis Renal failure (AKI resolved 1/5) rhabdo  Leukocytosis  Assessment &  Plan:   53 year old female history of alcohol abuse, hypertension, and seizures who was brought in after being found down with unknown last normal. Found to have 8.4 cm right basal ganglia hemorrhage.  Noted to have alcohol anxiety issues home  setting.  Right basal ganglia hemorrhage Malignant hypertension -family declined surgery has stable associated pulmonary edema with mass-effect P: 3% saline discontinued Adding saline at this point, goal 1 50-1 55 Serial neuro checks SBP goal less than 160 Continuing anticonvulsants per neuro Anticipating one-way extubation on 1/14 per discussion with family  H/o ETOH w/ toxic and  metabolic Encephalopathy complicating her picture s/p ICH P: Continuing Xanax as it was a chronic med  Continue current dose of thiamine, folate, and Depakote  Precedex for RASS goal of 0  :Acute Respiratory Insufficiency in setting of ICH  C/b RLL staph Pneumonia  Portable chest x-ray personally reviewed: This demonstrates improved right sided airspace disease overall improvement in aeration P: Day #5 of 7 antibiotics, currently on nafcillin  Continuing full ventilator support with pressure support as tolerated  Adjusting PAD protocol for rascal of 0  Continue Precedex  Family does not want tracheostomy, will be looking at a one-way extubation on 1/14   Rising creatinine/AKI P Increased saline Follow-up a.m. chemistry  Fluid and electrolyte imbalance: Hypernatremia (therapeutic) Hypokalemia  P:  Allowing sodium to normalize Replace potassium Serial chemistries  ? Tongue swelling vs just anasarca, she has been biting on her tongue P:  Supportive care  Anemia  -no evidence of bleeding currently  P:  Trend CBC  Best practice:  Diet: NPO, currently on tube feedings Pain/Anxiety/Delirium protocol (if indicated)history of anxiety is on as needed Xanax at home. Now on precedex  VAP protocol (if indicated): ordered DVT prophylaxis: contraindicated with bleed, scd's GI prophylaxis: PPI Glucose control: sliding scale insulin Mobility: contraindicated at this time Code Status: full code-->DNR as of 1/12 Family Communication: Spoke at length with the mother at bedside.  They understand poor  prognosis for functional recovery.  Patient had very clear core values which prioritize independence above all else.  Based on this they would not want tracheostomy.  We will maximize medical issues continue to keep sodium 150-155  CT not showing much change, adding IV saline as creatinine is bumped  Critical care time:  32 minutes   Erick Colace ACNP-BC [place] Pager # [phone] OR # [phone] if no answer

## 2018-03-06 NOTE — Progress Notes (Signed)
  Echocardiogram 2D Echocardiogram has been performed.  Jacqueline Pratt 03/06/2018, 3:06 PM

## 2018-03-06 NOTE — Progress Notes (Signed)
STROKE TEAM PROGRESS NOTE     SUBJECTIVE (INTERVAL HISTORY) Her mother and daughter are at the bedside.  She remains intubated and not following commands. Not open eyes but moving RUE and RLE for pain stimulation. CT repeat yesterday showed stable hematoma and MLS. Cre elevated today.    OBJECTIVE Vitals:   03/06/18 0600 03/06/18 0700 03/06/18 0751 03/06/18 0800  BP: (S) (!) 171/72 (!) 111/56 (!) 111/56 (!) 110/48  Pulse: 73 62 83 (!) 54  Resp: (!) 29 (!) 22 20 (!) 24  Temp:    99.9 F (37.7 C)  TempSrc:    Axillary  SpO2: 99% 99% 99% 98%  Weight:      Height:        CBC:  Recent Labs  Lab 03/04/18 0600 03/05/18 0510 03/06/18 0446  WBC 14.2* 12.7* 12.9*  NEUTROABS 9.5* 7.6  --   HGB 13.1 12.8 12.1  HCT 41.6 40.8 40.1  MCV 93.9 96.2 96.4  PLT 263 261 292    Basic Metabolic Panel:  Recent Labs  Lab 03/04/18 0600  03/05/18 0510  03/06/18 0025 03/06/18 0446  NA 153*   < > 157*   < > 155* 153*  K 3.2*   < > 3.1*  --   --  3.1*  CL 120*   < > 121*  --   --  119*  CO2 25   < > 25  --   --  24  GLUCOSE 129*   < > 128*  --   --  189*  BUN 18   < > 20  --   --  35*  CREATININE 0.83   < > 0.87  --   --  1.28*  CALCIUM 9.0   < > 9.2  --   --  8.9  MG 2.4  --  2.4  --   --   --   PHOS 2.2*  --  3.0  --   --   --    < > = values in this interval not displayed.    Lipid Panel:     Component Value Date/Time   CHOL 231 (H) 03/05/2018 0510   TRIG 146 03/05/2018 0510   HDL 31 (L) 03/05/2018 0510   CHOLHDL 7.5 03/05/2018 0510   VLDL 29 03/05/2018 0510   LDLCALC 171 (H) 03/05/2018 0510   HgbA1c:  Lab Results  Component Value Date   HGBA1C 5.8 (H) 03/05/2018   Urine Drug Screen:     Component Value Date/Time   LABOPIA NONE DETECTED 03/02/2018 2129   COCAINSCRNUR NONE DETECTED 03/20/2018 2129   LABBENZ NONE DETECTED 03/24/2018 2129   AMPHETMU NONE DETECTED 03/04/2018 2129   THCU NONE DETECTED 03/22/2018 2129   LABBARB NONE DETECTED 02/25/2018 2129    Alcohol  Level     Component Value Date/Time   ETH <10 02/25/2018 1716    IMAGING  Ct Head Wo Contrast 03/05/2018 IMPRESSION:  1. Stable hemorrhage in the right basal ganglia measuring up to 7.9 cm, 73 cc, when measured in a similar fashion.  2. Stable associated edema and mass effect with 10 mm right-to-left midline shift.  3. Stable small volume intraventricular hemorrhage. Stable ventricle size.  4. No new acute intracranial abnormality identified.    Dg Chest Port 1 View 03/05/2018 IMPRESSION:  Stable support apparatus. Stable bibasilar opacities as described above, left greater than right.    Dg Chest Port 1 View 03/04/2018 IMPRESSION:  Stable cardiomegaly and bibasilar atelectasis.  CT head: 1. Acute hemorrhage centered in right basal ganglia with extension into right cerebral hemisphere measuring up to 8.4 cm, 79 CC. 2. Extension of hemorrhage into the intraventricular system, greatest in the right lateral ventricle. 3. Mass effect with 10 mm right-to-left midline shift, right-sided uncal herniation, and partial effacement of basilar cisterns. No downward herniation at this time.  CT cervical spine. 1. No acute fracture or dislocation of the cervical spine. 2. 15 mm nodule within left lobe of thyroid gland. Further evaluation with thyroid ultrasound is recommended on a non emergent basis. 3. Mild spondylosis of the cervical spine.  CT brain repeat 02/27/18 1. Similar to slightly decreased size of large intraparenchymal hematoma centered at the right basal ganglia, estimated volume 71.9 cc on current exam, previously 75 cc. 2. Similar regional mass effect with up to 10 mm of right-to-left midline shift. 3. Associated intraventricular extension with blood primarily in the lateral and third ventricles, similar to previous. 4. No other new intracranial abnormality.   Transthoracic Echocardiogram  00/00/00 Pending    PHYSICAL EXAM  Temp:  [98.8 F (37.1  C)-100.5 F (38.1 C)] 99.9 F (37.7 C) (01/12 0800) Pulse Rate:  [48-131] 54 (01/12 0800) Resp:  [14-32] 24 (01/12 0800) BP: (96-205)/(48-96) 110/48 (01/12 0800) SpO2:  [96 %-100 %] 98 % (01/12 0800) FiO2 (%):  [40 %-100 %] 40 % (01/12 0800) Weight:  [87 kg] 87 kg (01/12 0300)  General - Well nourished, well developed, intubated on precedex.  Ophthalmologic - fundi not visualized due to noncooperation.  Cardiovascular - Regular rate and rhythm  Neuro - intubated on precedex, eyes closed, not following commands. With forced eye opening, eyes in right gaze position, not blinking to visual threat, doll's eyes present, not tracking, PERRL. Corneal reflex present, gag and cough present. Facial symmetry not able to test due to ET tube.  Tongue midline in mouth. On pain stimulation, RUE 3/5 and RLE withdraw to pain 2/5, however, LUE flaccid and LLE withdraw to pain 2-/5. DTR 1+ and no babinski. Sensation, coordination and gait not tested.   ASSESSMENT/PLAN Jacqueline Pratt is a 53 y.o. female with history of seizures, migraines, Htn, depression and anxiety brought to [facility] after being found unresponsive . She did not receive IV t-PA due to hemorrhage.  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 Stable associated edema and mass effect with 10 mm right-to-left midline shift.   CTA Head - Multiple intracranial aneurysms: 3 mm proximal cavernous left ICA aneurysm, 5 mm distal cavernous left ICA aneurysm, 3 mm supraclinoid left ICA aneurysm, 4 mm proximal cavernous right ICA aneurysm, 4 mm supraclinoid right ICA aneurysm, and 3 mm basilar tip aneurysm.  Carotid Doppler - not performed  2D Echo - pending  LDL - 171  HgbA1c - 5.8  UDS - negative  VTE prophylaxis - Lovenox  Diet - NPO  No antithrombotic prior to admission, now on No antithrombotic  Ongoing aggressive stroke risk factor management  Therapy  recommendations:  pending  Disposition:  Family refused trach at this time, given poor functional outcome, plan for one-way extubation on Tuesday.   Cerebral edema  Serial CT showed a stable midline shift 10 mm  Was on 3% saline, now on NS @ 50  Sodium 156-157->153  On Precedex  Sodium goal 150-155  Sodium check every 6 hours  Respiratory failure   Intubated for airway protection  On ventilation  CCM on board  On Precedex  Discussed with mom and daughter at bedside, they refused tracheostomy at this time  Pneumonia  Sputum culture positive for staph aureus  Temperature 100.4-101.2->100.5  Leukocytosis 14.2-12.7->12.9  On nafcillin  CXR stable bibasilar opacities, left more than right  UA pending  Hypertensive emergency  Stable   Now off Cleviprex  BP goal less than 160  On p.o. Norvasc . Long-term BP goal normotensive  Hyperlipidemia  Lipid lowering medication PTA:  none  LDL 171, goal < 70  Current lipid lowering medication: none  Hx of statin intolerance  On Zetia  Elevated Cre  Cre 0.87->1.28  Continue TF @ 50  Put on NS @ 50  BMP monitoring  Other Stroke Risk Factors  Former cigarette smoker - quit  ETOH use, advised to drink no more than 1 alcoholic beverage per day.  Obesity, Body mass index is 35.08 kg/m., recommend weight loss, diet and exercise as appropriate   Migraines  Other Active Problems  Seizure history - on Lamictal  Hypokalemia - K 3.1 supplement  15 mm nodule within left lobe of thyroid gland. Further evaluation with thyroid ultrasound is recommended on a non emergent basis.  Hospital day # 8   This patient is critically ill and at significant risk of neurological worsening, death and care requires constant monitoring of vital signs, hemodynamics,respiratory and cardiac monitoring, extensive review of multiple databases, frequent neurological assessment, discussion with family, other specialists and  medical decision making of high complexity.I have made any additions or clarifications directly to the above note. I spent 40 minutes of neurocritical care time  in the care of  this patient. I had long discussion with pt mom and daughter at bedside, updated pt current condition, treatment plan and potential prognosis. They expressed understanding and appreciation.    Rosalin Hawking, MD PhD Stroke Neurology 03/06/2018 10:01 AM  To contact Stroke Continuity provider, please refer to [email or web address] After hours, contact General Neurology

## 2018-03-07 DIAGNOSIS — R509 Fever, unspecified: Secondary | ICD-10-CM

## 2018-03-07 DIAGNOSIS — D72829 Elevated white blood cell count, unspecified: Secondary | ICD-10-CM

## 2018-03-07 LAB — BASIC METABOLIC PANEL
Anion gap: 8 (ref 5–15)
BUN: 34 mg/dL — ABNORMAL HIGH (ref 6–20)
CO2: 25 mmol/L (ref 22–32)
Calcium: 8.8 mg/dL — ABNORMAL LOW (ref 8.9–10.3)
Chloride: 121 mmol/L — ABNORMAL HIGH (ref 98–111)
Creatinine, Ser: 1.15 mg/dL — ABNORMAL HIGH (ref 0.44–1.00)
GFR calc Af Amer: >60 mL/min (ref >60)
GFR calc non Af Amer: 54 mL/min — ABNORMAL LOW (ref >60)
Glucose, Bld: 153 mg/dL — ABNORMAL HIGH (ref 70–99)
Potassium: 3.3 mmol/L — ABNORMAL LOW (ref 3.5–5.1)
Sodium: 154 mmol/L — ABNORMAL HIGH (ref 135–145)

## 2018-03-07 LAB — CBC
HEMATOCRIT: 37.5 % (ref 36.0–46.0)
Hemoglobin: 11.6 g/dL — ABNORMAL LOW (ref 12.0–15.0)
MCH: 30.2 pg (ref 26.0–34.0)
MCHC: 30.9 g/dL (ref 30.0–36.0)
MCV: 97.7 fL (ref 80.0–100.0)
Platelets: 266 10*3/uL (ref 150–400)
RBC: 3.84 MIL/uL — ABNORMAL LOW (ref 3.87–5.11)
RDW: 13.8 % (ref 11.5–15.5)
WBC: 11.2 10*3/uL — AB (ref 4.0–10.5)
nRBC: 0 % (ref 0.0–0.2)

## 2018-03-07 LAB — GLUCOSE, CAPILLARY
GLUCOSE-CAPILLARY: 135 mg/dL — AB (ref 70–99)
Glucose-Capillary: 150 mg/dL — ABNORMAL HIGH (ref 70–99)
Glucose-Capillary: 126 mg/dL — ABNORMAL HIGH (ref 70–99)
Glucose-Capillary: 154 mg/dL — ABNORMAL HIGH (ref 70–99)
Glucose-Capillary: 132 mg/dL — ABNORMAL HIGH (ref 70–99)
Glucose-Capillary: 118 mg/dL — ABNORMAL HIGH (ref 70–99)

## 2018-03-07 LAB — SODIUM: Sodium: 155 mmol/L — ABNORMAL HIGH (ref 135–145)

## 2018-03-07 MED ORDER — POTASSIUM CHLORIDE 20 MEQ/15ML (10%) PO SOLN
40.0000 meq | ORAL | Status: AC
  Administered 2018-03-07 (×2): 40 meq
  Filled 2018-03-07 (×2): qty 30

## 2018-03-07 NOTE — Clinical Social Work Note (Signed)
Clinical Social Worker made aware of consult for family concerns regarding patient finances.  CSW spoke with patient mother and sister at bedside to offer support and provide guidance regarding concerns.  Patient mother and sister had questions about applying for guardianship vs. Financial affairs if patient does not survive.  CSW provided information for available resources for guardianship and the potential need for a death certificate pending patient outcomes post extubation.  Patient family verbalized understanding and appreciation for information.  CSW remains available for support and to assist family as needed through difficult transition.  Barbette Or, [place]

## 2018-03-07 NOTE — Progress Notes (Signed)
STROKE TEAM PROGRESS NOTE   SUBJECTIVE (INTERVAL HISTORY) No family is at the bedside.  She remains intubated and not following commands. Not open eyes but moving RUE and RLE for pain stimulation.  Creatinine and leukocytosis improving.  Sodium 154.  Plan for mom extubation tomorrow   OBJECTIVE Vitals:   03/07/18 0758 03/07/18 0800 03/07/18 0900 03/07/18 1000  BP: (!) 202/90  (!) 172/65 (!) 167/68  Pulse: (!) 102 61 69 (!) 51  Resp: (!) 30 (!) 23 17 18   Temp:      TempSrc:      SpO2: 99% 100% 98% 98%  Weight:      Height:        CBC:  Recent Labs  Lab 03/04/18 0600 03/05/18 0510 03/06/18 0446 03/07/18 0452  WBC 14.2* 12.7* 12.9* 11.2*  NEUTROABS 9.5* 7.6  --   --   HGB 13.1 12.8 12.1 11.6*  HCT 41.6 40.8 40.1 37.5  MCV 93.9 96.2 96.4 97.7  PLT 263 261 264 785    Basic Metabolic Panel:  Recent Labs  Lab 03/04/18 0600  03/05/18 0510  03/06/18 0446  03/07/18 0015 03/07/18 0452  NA 153*   < > 157*   < > 153*   < > 155* 154*  K 3.2*   < > 3.1*  --  3.1*  --   --  3.3*  CL 120*   < > 121*  --  119*  --   --  121*  CO2 25   < > 25  --  24  --   --  25  GLUCOSE 129*   < > 128*  --  189*  --   --  153*  BUN 18   < > 20  --  35*  --   --  34*  CREATININE 0.83   < > 0.87  --  1.28*  --   --  1.15*  CALCIUM 9.0   < > 9.2  --  8.9  --   --  8.8*  MG 2.4  --  2.4  --   --   --   --   --   PHOS 2.2*  --  3.0  --   --   --   --   --    < > = values in this interval not displayed.    Lipid Panel:     Component Value Date/Time   CHOL 231 (H) 03/05/2018 0510   TRIG 146 03/05/2018 0510   HDL 31 (L) 03/05/2018 0510   CHOLHDL 7.5 03/05/2018 0510   VLDL 29 03/05/2018 0510   LDLCALC 171 (H) 03/05/2018 0510   HgbA1c:  Lab Results  Component Value Date   HGBA1C 5.8 (H) 03/05/2018   Urine Drug Screen:     Component Value Date/Time   LABOPIA NONE DETECTED 03/10/2018 2129   COCAINSCRNUR NONE DETECTED 02/24/2018 2129   LABBENZ NONE DETECTED 03/15/2018 2129   AMPHETMU  NONE DETECTED 03/08/2018 2129   THCU NONE DETECTED 02/27/2018 2129   LABBARB NONE DETECTED 02/25/2018 2129    Alcohol Level     Component Value Date/Time   ETH <10 02/25/2018 1716    IMAGING  Ct Head Wo Contrast 03/05/2018 IMPRESSION:  1. Stable hemorrhage in the right basal ganglia measuring up to 7.9 cm, 73 cc, when measured in a similar fashion.  2. Stable associated edema and mass effect with 10 mm right-to-left midline shift.  3. Stable small volume intraventricular hemorrhage. Stable  ventricle size.  4. No new acute intracranial abnormality identified.    Dg Chest Port 1 View 03/05/2018 IMPRESSION:  Stable support apparatus. Stable bibasilar opacities as described above, left greater than right.    Dg Chest Port 1 View 03/04/2018 IMPRESSION:  Stable cardiomegaly and bibasilar atelectasis.   CT head: 1. Acute hemorrhage centered in right basal ganglia with extension into right cerebral hemisphere measuring up to 8.4 cm, 79 CC. 2. Extension of hemorrhage into the intraventricular system, greatest in the right lateral ventricle. 3. Mass effect with 10 mm right-to-left midline shift, right-sided uncal herniation, and partial effacement of basilar cisterns. No downward herniation at this time.  CT cervical spine. 1. No acute fracture or dislocation of the cervical spine. 2. 15 mm nodule within left lobe of thyroid gland. Further evaluation with thyroid ultrasound is recommended on a non emergent basis. 3. Mild spondylosis of the cervical spine.  CT brain repeat 02/27/18 1. Similar to slightly decreased size of large intraparenchymal hematoma centered at the right basal ganglia, estimated volume 71.9 cc on current exam, previously 75 cc. 2. Similar regional mass effect with up to 10 mm of right-to-left midline shift. 3. Associated intraventricular extension with blood primarily in the lateral and third ventricles, similar to previous. 4. No other new intracranial  abnormality.   PHYSICAL EXAM  Temp:  [98.5 F (36.9 C)-100.6 F (38.1 C)] 99 F (37.2 C) (01/13 0400) Pulse Rate:  [48-128] 51 (01/13 1000) Resp:  [16-39] 18 (01/13 1000) BP: (96-202)/(44-102) 167/68 (01/13 1000) SpO2:  [97 %-100 %] 98 % (01/13 1000) FiO2 (%):  [40 %] 40 % (01/13 0758) Weight:  [89.4 kg] 89.4 kg (01/13 0100)  General - Well nourished, well developed, intubated on precedex.  Ophthalmologic - fundi not visualized due to noncooperation.  Cardiovascular - Regular rate and rhythm  Neuro - intubated on precedex, eyes closed, not following commands. With forced eye opening, eyes in right gaze position, not blinking to visual threat, doll's eyes present, not tracking, PERRL. Corneal reflex present, gag and cough present. Facial symmetry not able to test due to ET tube.  Tongue midline in mouth. On pain stimulation, RUE 3/5 and RLE withdraw to pain 2/5, however, LUE flaccid and LLE withdraw to pain 2-/5. DTR 1+ and no babinski. Sensation, coordination and gait not tested.   ASSESSMENT/PLAN Jacqueline Pratt is a 53 y.o. female with history of seizures, migraines, Htn, depression and anxiety brought to [facility] after being found unresponsive . She did not receive IV t-PA due to hemorrhage.  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 Stable associated edema and mass effect with 10 mm right-to-left midline shift.   CTA Head - Multiple intracranial aneurysms: 3 mm proximal cavernous left ICA aneurysm, 5 mm distal cavernous left ICA aneurysm, 3 mm supraclinoid left ICA aneurysm, 4 mm proximal cavernous right ICA aneurysm, 4 mm supraclinoid right ICA aneurysm, and 3 mm basilar tip aneurysm.  Carotid Doppler - not performed  2D Echo EF 60 to 65%  LDL - 171  HgbA1c - 5.8  UDS - negative  VTE prophylaxis - Lovenox  Diet - NPO  No antithrombotic prior to admission, now on No  antithrombotic  Ongoing aggressive stroke risk factor management  Therapy recommendations:  pending  Disposition:  Family refused trach at this time, given poor functional outcome, plan for one-way extubation tomorrow  Cerebral edema  Serial CT  showed a stable midline shift 10 mm  Was on 3% saline, now on NS @ 50  Sodium 156-157->153->154  On Precedex  Sodium goal 150-155  Respiratory failure   Intubated for airway protection  On ventilation  CCM on board  On Precedex  Discussed with mom and daughter at bedside, they refused tracheostomy at this time  Pneumonia  Sputum culture positive for staph aureus  Temperature 100.4-101.2->100.5->100.6  Leukocytosis 14.2-12.7->12.9->11.2  On nafcillin  CXR stable bibasilar opacities, left more than right  Hypertensive emergency  Stable   Now off Cleviprex  BP goal less than 160  On p.o. Norvasc . Long-term BP goal normotensive  Hyperlipidemia  Lipid lowering medication PTA:  none  LDL 171, goal < 70  Current lipid lowering medication: none  Hx of statin intolerance  On Zetia  Elevated Cre, improving  Cre 0.87->1.28->1.15  Continue TF @ 50  Continue on NS @ 50  BMP monitoring  Other Stroke Risk Factors  Former cigarette smoker - quit  ETOH use, advised to drink no more than 1 alcoholic beverage per day.  Obesity, Body mass index is 36.05 kg/m., recommend weight loss, diet and exercise as appropriate   Migraines  Other Active Problems  Seizure history - on Lamictal  Hypokalemia - K 3.1->3.3 supplement  15 mm nodule within left lobe of thyroid gland. Further evaluation with thyroid ultrasound is recommended on a non emergent basis.  Hospital day # 9   This patient is critically ill and at significant risk of neurological worsening, death and care requires constant monitoring of vital signs, hemodynamics,respiratory and cardiac monitoring, extensive review of multiple databases,  frequent neurological assessment, discussion with family, other specialists and medical decision making of high complexity.I have made any additions or clarifications directly to the above note. I spent 40 minutes of neurocritical care time  in the care of  this patient. I also discussed with CCM Dr. Thera Flake, MD PhD Stroke Neurology 03/07/2018 10:59 AM  To contact Stroke Continuity provider, please refer to [email or web address] After hours, contact General Neurology

## 2018-03-07 NOTE — Progress Notes (Signed)
NAME:  Jacqueline Pratt, MRN:  [id number], DOB:  1965/10/29, LOS: 9 ADMISSION DATE:  03/08/2018, CONSULTATION DATE:  03/11/2018 REFERRING MD:  Kerney Elbe, CHIEF COMPLAINT:  Found down   Brief History   26F found down, 8 cm basal ganglia hemorrhage. Intubated in ED   Past Medical History  Alcohol abuse, Hypertension & Seizures  Significant Hospital Events   1/04 Admit with AMS 1/6 remains on Cleviprex. Agitated at times. No sig change  1/7: Weaning.  Neuro exam the same.  Blood pressure controlled.  Sputum sent for right lower lobe infiltrate and mild leukocytosis 1/8: CT head stable.  Growing staph in sputum empirically starting antibiotics.  Working on Careers adviser. 1/9/ Depakote and Xanax to pharmaceutical regimen due to history of anxiolytic use and EtOH abuse. Nafcillin started for MSSA PNA 1/10: either agitated or sedated. Not able to protect airway.  1/11 still w/ intermittent fevers. Tolerating PSV but not ready to extubate adding precedex to see if will help w/ MS  1/12: Looks the same, CT scan from the 11th unchanged.  Goals of care discussion with family.  Patient does not want tracheostomy, requests DO NOT RESUSCITATE status.  Planning for one-way extubation on 1/14 given another 48 hours of maximizing medical issues 1/13: No significant changes, family anticipating one-way extubation on the 14th Consults:  PCCM  Procedures:  ETT 1/4 >>  Significant Diagnostic Tests:  1/04 CT Head >> 8.4 cm hemorrhage in right basal ganglia with right sided uncal hernation 1/6: CT head:Large right cerebral hemorrhage, multiple intracranial aneurysms in left ICA.  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  No significant change in large intraparenchymal hematoma in the right basal ganglia slight increase in localized vasogenic edema ongoing mass-effect right to left shift CT brain 1/7:no sig change  CT brain 1/11: Stable hemorrhage in the right basal  ganglia stable associated edema with mass-effect and right to left midline shift stable intraventricular hemorrhage Micro Data:   Respiratory culture 1/7: SA Antimicrobials:  vanc 1/8 >>> 03/03/2018 Nafcillin 03/03/2018>>   Subjective:  Looks the same Objective   Blood pressure (Abnormal) 202/90, pulse 61, temperature 99 F (37.2 C), temperature source Axillary, resp. rate (Abnormal) 23, height 5\' 2"  (1.575 m), weight 89.4 kg, SpO2 100 %.    Vent Mode: PRVC FiO2 (%):  [40 %] 40 % Set Rate:  [18 bmp] 18 bmp Vt Set:  [430 mL] 430 mL PEEP:  [5 cmH20] 5 cmH20 Plateau Pressure:  [18 cmH20-21 cmH20] 19 cmH20   Intake/Output Summary (Last 24 hours) at 03/07/2018 0919 Last data filed at 03/07/2018 0800 Gross per 24 hour  Intake 2769.12 ml  Output 1348 ml  Net 1421.12 ml   Filed Weights   03/05/18 0500 03/06/18 0300 03/07/18 0100  Weight: 90 kg 87 kg 89.4 kg    Examination:  General: 53 year old female patient she remains on full ventilator support.  She has had no significant change in her exam for several days now HEENT mucous membranes are moist.  Her tongue is swollen, she has bitten her tongue.  Orally intubated, does have some bloody oral's foul-smelling secretions Pulmonary: Scattered rhonchi no accessory use Cardiac: Regular rate and rhythm Abdomen: Soft not tender no organomegaly Neuro: Will not open eyes, does not follow commands, moves semi-purposeful with the right upper extremity and right lower extremity, remains hemiparetic on the left GU: Clear yellow     ag metabolic acidosis Renal failure (AKI resolved 1/5) rhabdo  Leukocytosis  Assessment & Plan:   53 year old female history of alcohol abuse, hypertension, and seizures who was brought in after being found down with unknown last normal. Found to have 8.4 cm right basal ganglia hemorrhage.  Noted to have alcohol anxiety issues home setting.  Right basal ganglia hemorrhage Malignant hypertension -family  declined surgery has stable associated pulmonary edema with mass-effect P: Na goal 150-155 Cont NS infusion  Serial neuro checks SBP goal < 160 Anticonvulsants per neuro    H/o ETOH w/ toxic and  metabolic Encephalopathy complicating her picture s/p ICH P: Cont xanax as chronic med Cont thiamine, folate and depakote.  Cont precedex   :Acute Respiratory Insufficiency in setting of ICH  C/b RLL staph Pneumonia  Portable chest x-ray personally reviewed: This demonstrates improved right sided airspace disease overall improvement in aeration P: Day # 6 of 7 naf Cont full vent support PAD protocol RASS goal -2 VAP bundle  No change in precedex PRN fent Plan for one way extubation 1/14. I suspect we will need to transition to comfort.   AKI P No change in current IVFs No escalation  Fluid and electrolyte imbalance: Hypernatremia (therapeutic) Hypokalemia  P: Allowing Na to normalize  Replace K  ? Tongue swelling vs just anasarca, she has been biting on her tongue P: Supportive care   Anemia  -no evidence of bleeding currently  P: Trend cbc  Best practice:  Diet: NPO, currently on tube feedings Pain/Anxiety/Delirium protocol (if indicated)history of anxiety is on as needed Xanax at home. Now on precedex  VAP protocol (if indicated): ordered DVT prophylaxis: contraindicated with bleed, scd's GI prophylaxis: PPI Glucose control: sliding scale insulin Mobility: contraindicated at this time Code Status: full code-->DNR as of 1/12 Family Communication: Spoke at length with the mother at bedside.  They understand poor prognosis for functional recovery.  Patient had very clear core values which prioritize independence above all else.  Based on this they would not want tracheostomy.  We will maximize medical issues continue to keep sodium 150-155  CT not showing much change, adding IV saline as creatinine is bumped  Critical care time:  31 minutes   Erick Colace  ACNP-BC [place] Pager # [phone] OR # [phone] if no answer

## 2018-03-08 DIAGNOSIS — R0602 Shortness of breath: Secondary | ICD-10-CM

## 2018-03-08 DIAGNOSIS — E669 Obesity, unspecified: Secondary | ICD-10-CM

## 2018-03-08 LAB — CBC
HCT: 34.3 % — ABNORMAL LOW (ref 36.0–46.0)
Hemoglobin: 10.4 g/dL — ABNORMAL LOW (ref 12.0–15.0)
MCH: 30.3 pg (ref 26.0–34.0)
MCHC: 30.3 g/dL (ref 30.0–36.0)
MCV: 100 fL (ref 80.0–100.0)
Platelets: 218 10*3/uL (ref 150–400)
RBC: 3.43 MIL/uL — ABNORMAL LOW (ref 3.87–5.11)
RDW: 13.9 % (ref 11.5–15.5)
WBC: 9.8 10*3/uL (ref 4.0–10.5)
nRBC: 0 % (ref 0.0–0.2)

## 2018-03-08 LAB — CULTURE, BLOOD (ROUTINE X 2)
Culture: NO GROWTH
Culture: NO GROWTH
Special Requests: ADEQUATE

## 2018-03-08 LAB — COMPREHENSIVE METABOLIC PANEL
ALT: 44 U/L (ref 0–44)
AST: 25 U/L (ref 15–41)
Albumin: 2.4 g/dL — ABNORMAL LOW (ref 3.5–5.0)
Alkaline Phosphatase: 64 U/L (ref 38–126)
Anion gap: 7 (ref 5–15)
BUN: 41 mg/dL — ABNORMAL HIGH (ref 6–20)
CO2: 23 mmol/L (ref 22–32)
Calcium: 8.5 mg/dL — ABNORMAL LOW (ref 8.9–10.3)
Chloride: 125 mmol/L — ABNORMAL HIGH (ref 98–111)
Creatinine, Ser: 1.22 mg/dL — ABNORMAL HIGH (ref 0.44–1.00)
GFR calc Af Amer: 59 mL/min — ABNORMAL LOW (ref >60)
GFR calc non Af Amer: 51 mL/min — ABNORMAL LOW (ref >60)
GLUCOSE: 201 mg/dL — AB (ref 70–99)
Potassium: 3.4 mmol/L — ABNORMAL LOW (ref 3.5–5.1)
Sodium: 155 mmol/L — ABNORMAL HIGH (ref 135–145)
Total Bilirubin: 0.9 mg/dL (ref 0.3–1.2)
Total Protein: 5.7 g/dL — ABNORMAL LOW (ref 6.5–8.1)

## 2018-03-08 LAB — GLUCOSE, CAPILLARY: Glucose-Capillary: 160 mg/dL — ABNORMAL HIGH (ref 70–99)

## 2018-03-08 MED ORDER — DEXTROSE 5 % IV SOLN: INTRAVENOUS | Status: DC

## 2018-03-08 MED ORDER — MORPHINE SULFATE (PF) 4 MG/ML IV SOLN
4.0000 mg | INTRAVENOUS | Status: DC | PRN
  Administered 2018-03-08 (×5): 4 mg via INTRAVENOUS
  Filled 2018-03-08 (×5): qty 1

## 2018-03-08 MED ORDER — FENTANYL CITRATE (PF) 100 MCG/2ML IJ SOLN: 50.0000 ug | INTRAMUSCULAR | Status: DC | PRN

## 2018-03-08 MED ORDER — ACETAMINOPHEN 650 MG RE SUPP: 650.0000 mg | Freq: Four times a day (QID) | RECTAL | Status: DC | PRN

## 2018-03-08 MED ORDER — FENTANYL 2500MCG IN NS 250ML (10MCG/ML) PREMIX INFUSION
0.0000 ug/h | INTRAVENOUS | Status: DC
  Administered 2018-03-08: 40 ug/h via INTRAVENOUS
  Administered 2018-03-08: 50 ug/h via INTRAVENOUS
  Administered 2018-03-09: 300 ug/h via INTRAVENOUS
  Filled 2018-03-08 (×3): qty 250

## 2018-03-08 MED ORDER — GLYCOPYRROLATE 0.2 MG/ML IJ SOLN: 0.2000 mg | INTRAMUSCULAR | Status: DC | PRN

## 2018-03-08 MED ORDER — ACETAMINOPHEN 325 MG PO TABS: 650.0000 mg | ORAL_TABLET | Freq: Four times a day (QID) | ORAL | Status: DC | PRN

## 2018-03-08 MED ORDER — GLYCOPYRROLATE 0.2 MG/ML IJ SOLN
0.2000 mg | INTRAMUSCULAR | Status: DC | PRN
  Administered 2018-03-08 (×4): 0.2 mg via INTRAVENOUS
  Filled 2018-03-08 (×4): qty 1

## 2018-03-08 MED ORDER — GLYCOPYRROLATE 1 MG PO TABS
1.0000 mg | ORAL_TABLET | ORAL | Status: DC | PRN
  Filled 2018-03-08: qty 1

## 2018-03-08 MED ORDER — DIPHENHYDRAMINE HCL 50 MG/ML IJ SOLN: 25.0000 mg | INTRAMUSCULAR | Status: DC | PRN

## 2018-03-08 MED ORDER — FENTANYL BOLUS VIA INFUSION
100.0000 ug | INTRAVENOUS | Status: DC | PRN
  Administered 2018-03-08: 100 ug via INTRAVENOUS
  Filled 2018-03-08: qty 100

## 2018-03-08 MED ORDER — POLYVINYL ALCOHOL 1.4 % OP SOLN
1.0000 [drp] | Freq: Four times a day (QID) | OPHTHALMIC | Status: DC | PRN
  Filled 2018-03-08: qty 15

## 2018-03-08 NOTE — Progress Notes (Signed)
[facility] [place] Progress Note Patient Name: Jacqueline Pratt DOB: 1965-10-25 MRN: [id number]   Date of Service  03/08/2018  HPI/Events of Note  Patient self extubated.  The family had planned for a terminal extubation tomorrow. Patient's sister, Jacqueline Pratt, voices that she and the family do not wish to have the ETT replaced. She states that she has spoken with the patient's mother about this course of action and both are in agreement not to replace the ETT at this time and to permit her sister to pass with comfort and dignity.   eICU Interventions  Will proceed with comfort measures per the withdrawal of life sustaining care protocol.      Intervention Category Major Interventions: End of life / care limitation discussion  Lysle Dingwall 03/08/2018, 6:01 AM

## 2018-03-08 NOTE — Progress Notes (Signed)
STROKE TEAM PROGRESS NOTE   SUBJECTIVE (INTERVAL HISTORY) Mom is at the bedside.  She self extubated this am but not able to tolerating and developed respiratory distress. Mom confirmed that family would like comfort care given the poor prognosis. End of life order placed.   OBJECTIVE Vitals:   03/08/18 0400 03/08/18 0402 03/08/18 0500 03/08/18 0600  BP: (!) 92/39  (!) 97/40 (!) 105/46  Pulse: (!) 41 (!) 45 (!) 44 62  Resp: 17 (!) 29 (!) 21 (!) 24  Temp: 99.1 F (37.3 C)     TempSrc: Axillary     SpO2: 99% 100% 94% 99%  Weight:   89.1 kg   Height:        CBC:  Recent Labs  Lab 03/04/18 0600 03/05/18 0510  03/07/18 0452 03/08/18 0500  WBC 14.2* 12.7*   < > 11.2* 9.8  NEUTROABS 9.5* 7.6  --   --   --   HGB 13.1 12.8   < > 11.6* 10.4*  HCT 41.6 40.8   < > 37.5 34.3*  MCV 93.9 96.2   < > 97.7 100.0  PLT 263 261   < > 266 218   < > = values in this interval not displayed.    Basic Metabolic Panel:  Recent Labs  Lab 03/04/18 0600  03/05/18 0510  03/07/18 0452 03/08/18 0500  NA 153*   < > 157*   < > 154* 155*  K 3.2*   < > 3.1*   < > 3.3* 3.4*  CL 120*   < > 121*   < > 121* 125*  CO2 25   < > 25   < > 25 23  GLUCOSE 129*   < > 128*   < > 153* 201*  BUN 18   < > 20   < > 34* 41*  CREATININE 0.83   < > 0.87   < > 1.15* 1.22*  CALCIUM 9.0   < > 9.2   < > 8.8* 8.5*  MG 2.4  --  2.4  --   --   --   PHOS 2.2*  --  3.0  --   --   --    < > = values in this interval not displayed.    Lipid Panel:     Component Value Date/Time   CHOL 231 (H) 03/05/2018 0510   TRIG 146 03/05/2018 0510   HDL 31 (L) 03/05/2018 0510   CHOLHDL 7.5 03/05/2018 0510   VLDL 29 03/05/2018 0510   LDLCALC 171 (H) 03/05/2018 0510   HgbA1c:  Lab Results  Component Value Date   HGBA1C 5.8 (H) 03/05/2018   Urine Drug Screen:     Component Value Date/Time   LABOPIA NONE DETECTED 03/06/2018 2129   COCAINSCRNUR NONE DETECTED 03/08/2018 2129   LABBENZ NONE DETECTED 03/02/2018 2129   AMPHETMU  NONE DETECTED 03/07/2018 2129   THCU NONE DETECTED 03/14/2018 2129   LABBARB NONE DETECTED 03/25/2018 2129    Alcohol Level     Component Value Date/Time   ETH <10 03/10/2018 1716    IMAGING  Ct Head Wo Contrast 03/05/2018 IMPRESSION:  1. Stable hemorrhage in the right basal ganglia measuring up to 7.9 cm, 73 cc, when measured in a similar fashion.  2. Stable associated edema and mass effect with 10 mm right-to-left midline shift.  3. Stable small volume intraventricular hemorrhage. Stable ventricle size.  4. No new acute intracranial abnormality identified.    Dg Chest [facility]  1 View 03/05/2018 IMPRESSION:  Stable support apparatus. Stable bibasilar opacities as described above, left greater than right.    Dg Chest Port 1 View 03/04/2018 IMPRESSION:  Stable cardiomegaly and bibasilar atelectasis.   CT head: 1. Acute hemorrhage centered in right basal ganglia with extension into right cerebral hemisphere measuring up to 8.4 cm, 79 CC. 2. Extension of hemorrhage into the intraventricular system, greatest in the right lateral ventricle. 3. Mass effect with 10 mm right-to-left midline shift, right-sided uncal herniation, and partial effacement of basilar cisterns. No downward herniation at this time.  CT cervical spine. 1. No acute fracture or dislocation of the cervical spine. 2. 15 mm nodule within left lobe of thyroid gland. Further evaluation with thyroid ultrasound is recommended on a non emergent basis. 3. Mild spondylosis of the cervical spine.  CT brain repeat 02/27/18 1. Similar to slightly decreased size of large intraparenchymal hematoma centered at the right basal ganglia, estimated volume 71.9 cc on current exam, previously 75 cc. 2. Similar regional mass effect with up to 10 mm of right-to-left midline shift. 3. Associated intraventricular extension with blood primarily in the lateral and third ventricles, similar to previous. 4. No other new intracranial  abnormality.   PHYSICAL EXAM  Temp:  [98.9 F (37.2 C)-100 F (37.8 C)] 99.1 F (37.3 C) (01/14 0400) Pulse Rate:  [41-89] 62 (01/14 0600) Resp:  [16-29] 24 (01/14 0600) BP: (92-185)/(34-84) 105/46 (01/14 0600) SpO2:  [94 %-100 %] 99 % (01/14 0600) FiO2 (%):  [40 %] 40 % (01/14 0600) Weight:  [89.1 kg] 89.1 kg (01/14 0500)  General - Well nourished, well developed, self extubated with respiratory distress.  Ophthalmologic - fundi not visualized due to noncooperation.  Cardiovascular - Regular rate and rhythm  Neuro - exam limited due to comfort care measures. self extubated and with respiratory distress, eyes closed, not following commands. Left facial droop.  Tongue midline in mouth. Spontaneous movement of RUE but no other extremities. Sensation, coordination and gait not tested.   ASSESSMENT/PLAN [name] is a 53 y.o. female with history of seizures, migraines, Htn, depression and anxiety brought to [facility] after being found unresponsive . She did not receive IV t-PA due to hemorrhage.  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 Stable associated edema and mass effect with 10 mm right-to-left midline shift.   CTA Head - Multiple intracranial aneurysms: 3 mm proximal cavernous left ICA aneurysm, 5 mm distal cavernous left ICA aneurysm, 3 mm supraclinoid left ICA aneurysm, 4 mm proximal cavernous right ICA aneurysm, 4 mm supraclinoid right ICA aneurysm, and 3 mm basilar tip aneurysm.  Carotid Doppler - not performed  2D Echo EF 60 to 65%  LDL - 171  HgbA1c - 5.8  UDS - negative  VTE prophylaxis - Lovenox  Diet - NPO  No antithrombotic prior to admission, now on No antithrombotic  Disposition:  Pt self extubated this am and developed severe respiratory distress, family request comfort care measures  Cerebral edema  Serial CT showed a stable midline shift 10 mm  Was on 3%  saline, now on NS @ 50  Sodium 156-157->153->154  Family requested comfort care measures  Respiratory failure   Intubated for airway protection  Self extubated this am  CCM on board  Discussed with mom and she request comfort care  Pneumonia  Sputum culture positive for staph aureus  Temperature 100.4-101.2->100.5->100.6->afebile  Leukocytosis 14.2-12.7->12.9->11.2->9.8  On nafcillin  CXR stable bibasilar opacities, left more than right  Hypertensive emergency  Stable   Now off Cleviprex  Hyperlipidemia  Lipid lowering medication PTA:  none  LDL 171, goal < 70  Current lipid lowering medication: none  Hx of statin intolerance  Elevated Cre  Cre 0.87->1.28->1.15->1.22  Other Stroke Risk Factors  Former cigarette smoker - quit  ETOH use, advised to drink no more than 1 alcoholic beverage per day.  Obesity, Body mass index is 35.93 kg/m., recommend weight loss, diet and exercise as appropriate   Migraines  Other Active Problems  Seizure history - on Lamictal  Hypokalemia - K 3.1->3.3->3.4   Hospital day # 10   This patient is critically ill and at significant risk of neurological worsening, death and care requires constant monitoring of vital signs, hemodynamics,respiratory and cardiac monitoring, extensive review of multiple databases, frequent neurological assessment, discussion with family, other specialists and medical decision making of high complexity.I have made any additions or clarifications directly to the above note. I spent 35 minutes of neurocritical care time  in the care of  this patient. I also discussed with CCM Dr. Vaughan Browner. I had long discussion with mom at bedside, updated pt current condition and poor prognosis. Mom requested comfort care measures.    Rosalin Hawking, MD PhD Stroke Neurology 03/08/2018 10:54 AM  To contact Stroke Continuity provider, please refer to [email or web address] After hours, contact General Neurology

## 2018-03-08 NOTE — Progress Notes (Signed)
NAME:  Jacqueline Pratt, MRN:  [id number], DOB:  Aug 16, 1965, LOS: 10 ADMISSION DATE:  02/25/2018, CONSULTATION DATE:  03/22/2018 REFERRING MD:  Kerney Elbe, CHIEF COMPLAINT:  Found down   Brief History   35F found down, 8 cm basal ganglia hemorrhage. Intubated in ED   Past Medical History  Alcohol abuse, Hypertension & Seizures  Significant Hospital Events   1/04 Admit with AMS 1/6 remains on Cleviprex. Agitated at times. No sig change  1/7: Weaning.  Neuro exam the same.  Blood pressure controlled.  Sputum sent for right lower lobe infiltrate and mild leukocytosis 1/8: CT head stable.  Growing staph in sputum empirically starting antibiotics.  Working on Careers adviser. 1/9/ Depakote and Xanax to pharmaceutical regimen due to history of anxiolytic use and EtOH abuse. Nafcillin started for MSSA PNA 1/10: either agitated or sedated. Not able to protect airway.  1/11 still w/ intermittent fevers. Tolerating PSV but not ready to extubate adding precedex to see if will help w/ MS  1/12: Looks the same, CT scan from the 11th unchanged.  Goals of care discussion with family.  Patient does not want tracheostomy, requests DO NOT RESUSCITATE status.  Planning for one-way extubation on 1/14 given another 48 hours of maximizing medical issues 1/13: No significant changes, family anticipating one-way extubation on the 14th 1/14: Self extubation through the night. Not re-tubed, comfort care initiated by E link through the night. Consults:  PCCM  Procedures:  ETT 1/4 >>1/14 ( Self Extubation)  Significant Diagnostic Tests:  1/04 CT Head >> 8.4 cm hemorrhage in right basal ganglia with right sided uncal hernation 1/6: CT head:Large right cerebral hemorrhage, multiple intracranial aneurysms in left ICA.  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  No significant change in large intraparenchymal hematoma in the right basal ganglia slight increase in localized  vasogenic edema ongoing mass-effect right to left shift CT brain 1/7:no sig change  CT brain 1/11: Stable hemorrhage in the right basal ganglia stable associated edema with mass-effect and right to left midline shift stable intraventricular hemorrhage Micro Data:   Respiratory culture 1/7: SA Antimicrobials:  vanc 1/8 >>> 03/03/2018 Nafcillin 03/03/2018>>   Subjective:  Extubated, on oxygen at 4L [place] at present with sats 87%, RR 24 Objective   Blood pressure (!) 105/46, pulse 62, temperature 99.1 F (37.3 C), temperature source Axillary, resp. rate (!) 24, height 5\' 2"  (1.575 m), weight 89.1 kg, SpO2 99 %.    Vent Mode: PRVC FiO2 (%):  [40 %] 40 % Set Rate:  [18 bmp] 18 bmp Vt Set:  [430 mL] 430 mL PEEP:  [5 cmH20] 5 cmH20 Plateau Pressure:  [18 cmH20-22 cmH20] 22 cmH20   Intake/Output Summary (Last 24 hours) at 03/08/2018 0902 Last data filed at 03/08/2018 0617 Gross per 24 hour  Intake 2343.55 ml  Output 740 ml  Net 1603.55 ml   Filed Weights   03/06/18 0300 03/07/18 0100 03/08/18 0500  Weight: 87 kg 89.4 kg 89.1 kg    Examination:  General: 53 YO female on 4 L , Fentanyl at 200 mcg/hr, She appears comfortable HEENT: mucous membranes are moist.  Her tongue is swollen, she has bitten her tongue.  Orally intubated, does have some bloody oral's foul-smelling secretions Pulmonary: Rhonchi throughout, diminished per bases Cardiac: S1, S2, Irr rate and rhythm, No RMG Abdomen: Soft not tender no organomegaly, obese Neuro: No eye opening, does not follow commands, moves semi-purposeful with the right upper extremity and right lower  extremity, remains hemiparetic on the left with stimulation GU: Clear yellow     ag metabolic acidosis Renal failure (AKI resolved 1/5) rhabdo  Leukocytosis  Assessment & Plan:   53 year old female history of alcohol abuse, hypertension, and seizures who was brought in after being found down with unknown last normal. Found to have 8.4 cm right  basal ganglia hemorrhage.  Noted to have alcohol anxiety issues home setting.Self extubated 1/14 early am>> DNR and full comfort care.  DNR>> Comfort Care Plan Continue Fentanyl gtt as ordered Titrate for comfort Morphine pushes prn for comfort Robinul IV  now for secretions  Right basal ganglia hemorrhage Malignant hypertension -family declined surgery has stable associated pulmonary edema with mass-effect P: DNR and comfort care per family  H/o ETOH w/ toxic and  metabolic Encephalopathy complicating her picture s/p ICH P: Comfort care  Acute Respiratory Insufficiency in setting of ICH  C/b RLL staph Pneumonia  Self extubated 1/14>> Full comfort care P: Fentanyl gtt  Titrate for comfort Morphine pushes Robinul for secretions Titrate oxygen for comfort  ? Tongue swelling vs just anasarca, she has been biting on her tongue P: Supportive/ comfort care care     Best practice:  Diet: NPO, Comfort care, TF off Pain/Anxiety/Delirium protocol (if indicated)Fentanyl gtt with morphine pushes VAP protocol (if indicated): ordered DVT prophylaxis:NA GI prophylaxis: NA Glucose control: comfort care Mobility: contraindicated at this time Code Status: DNR as of 1/12 Family Communication: Spoke with family 1/14 am. Patient appears comfortable on Fentanyl gtt. Will add Morphine push as needed for comfort. Family are at peace with their decision for DNR and comfort care.  Critical care time:    Magdalen Spatz, [facility] Pager # [phone]  # [phone] if no answer 03/08/2018 9:03 AM

## 2018-03-26 NOTE — Death Summary Note (Signed)
DEATH SUMMARY   Patient Details  Name: Jacqueline Pratt MRN: [id number] DOB: 15-May-1965  Admission/Discharge Information   Admit Date:  03/26/2018  Date of Death: Date of Death: April 06, 2018  Time of Death: Time of Death: 0413  Length of Stay: 2022-06-01  Referring Physician: Orinda Kenner, NP   Reason(s) for Hospitalization  right brain hemorrhage  Diagnoses  Preliminary cause of death:    ICH (intracerebral hemorrhage) ([place])  Secondary Diagnoses (including complications and co-morbidities):  HTN Hypertensive emergency Cerebral edema Respiratory failure Pneumonia Hyperlipidemia AKI Obesity Former smoker History of procedure Hypokalemia      Brief Hospital Course (including significant findings, care, treatment, and services provided and events leading to death)   Jacqueline Pratt is a 53 y.o. female with history of seizures, migraines, Htn, depression and anxiety brought to [facility] after being found unresponsive . She did not receive IV t-PA due to hemorrhage.  ICH - Acute right basal ganglia hemorrhageetiology likely hypertensive  Serial CT head x 4 -  Stable large right BG ICH. Stable associated edema and mass effect with 10 mm right-to-left midline shift.   CTA Head - Multiple intracranial aneurysms: 3 mm proximal cavernous left ICA aneurysm, 5 mm distal cavernous left ICA aneurysm, 3 mm supraclinoid left ICA aneurysm, 4 mm proximal cavernous right ICA aneurysm, 4 mm supraclinoid right ICA aneurysm, and 3 mm basilar tip aneurysm.  Carotid Doppler - not performed  2D Echo EF 60 to 65%  LDL - 171  HgbA1c - 5.8  UDS - negative  VTE prophylaxis - Lovenox  Diet - NPO  No antithrombotic prior to admission, now on No antithrombotic  Disposition:  Pt self extubated on 03/08/2018 and developed severe respiratory distress, family request comfort care measures.  Patient deceased on 2018/04/06 0413.  Cerebral edema  Serial CT showed a stable midline shift 10  mm  Was on 3% saline, now on NS @ 50  Sodium 156-157->153->154  Family requested comfort care measures  Respiratory failure   Intubated for airway protection  Self extubated this am  CCM on board  Discussed with mom and she request comfort care  Pneumonia  Sputum culture positive for staph aureus  Temperature 100.4-101.2->100.5->100.6->afebile   Leukocytosis 14.2-12.7->12.9->11.2->9.8  On nafcillin  CXR stable bibasilar opacities, left more than right  Hypertensive emergency  Stable   Now off Cleviprex  Hyperlipidemia  Lipid lowering medication PTA:  none  LDL 171, goal < 70  Current lipid lowering medication: none  Hx of statin intolerance  AKI  Cre 0.87->1.28->1.15->1.22  IV fluid  Family requested comfort care measures only  Other Stroke Risk Factors  Former cigarette smoker - quit  ETOH use, advised to drink no more than 1 alcoholic beverage per day.  Obesity, Body mass index is 35.93 kg/m., recommend weight loss, diet and exercise as appropriate   Migraines  Other Active Problems  Seizure history - on Lamictal  Hypokalemia - K 3.1->3.3->3.4  Pertinent Labs and Studies  Significant Diagnostic Studies Ct Angio Head W Or Wo Contrast  Result Date: 02/28/2018 CLINICAL DATA:  Follow-up examination for intracranial hemorrhage. EXAM: CT ANGIOGRAPHY HEAD TECHNIQUE: Multidetector CT imaging of the head was performed using the standard protocol during bolus administration of intravenous contrast. Multiplanar CT image reconstructions and MIPs were obtained to evaluate the vascular anatomy. CONTRAST:  <See Chart> ISOVUE-370 IOPAMIDOL (ISOVUE-370) INJECTION 76% COMPARISON:  Prior CT from 02/27/2018. FINDINGS: CT HEAD Brain: Large intraparenchymal hemorrhage centered at the right basal ganglia relatively unchanged  in size, measuring 8.4 x 4.1 x 4.4 cm, stable when measured at similar levels on previous exam. Surrounding low-density vasogenic  edema has increased and is now more hypodense and well-defined as compared to previous exam. Associated regional mass effect with up to 10 mm of right-to-left midline shift, stable. Right lateral ventricle is partially effaced. Intraventricular extension with blood in the lateral and third ventricles, relatively similar. Relatively stable ventricular size and morphology without worsening hydrocephalus or ventricular entrapment. Partial basilar cistern effacement similar. No transtentorial herniation. No other new intracranial hemorrhage. No acute large vessel territory infarct. No extra-axial fluid collection. Vascular: No hyperdense vessel. Skull: Resolving left scalp contusion.  Calvarium unchanged. Sinuses: Moderate mucosal thickening throughout the paranasal sinuses with associated air-fluid levels. Fluid within the nasopharynx. Patient is intubated. Small bilateral mastoid effusions noted. Orbits: Globes and orbital soft tissues demonstrate no acute finding. CTA HEAD Anterior circulation: Visualized distal cervical segments of the internal carotid arteries are tortuous and mildly irregular but patent bilaterally. Petrous segments patent bilaterally. Scattered atheromatous irregularity throughout the cavernous/supraclinoid ICAs with resultant mild to moderate multifocal narrowing (no more than 50%). 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 There is a second aneurysm arising from the distal cavernous segment measuring 5 x 4 x 4 mm (series 13, image 109). This is directed medially and slightly posteriorly. A third aneurysm seen just distally arising from the supraclinoid left ICA measures 3 mm, and is directed anteriorly and slightly laterally (series 13, image 105). On the right, 4 mm focal outpouching arising from the proximal cavernous right ICA suspicious for a small aneurysm. This is directed laterally (series  13, image 118). There is a second aneurysm arising from the supraclinoid right ICA distally that measures 4 x 4 x 3 mm (series 13, image 109). This is directed inferiorly and slightly posteriorly. ICA termini patent bilaterally. Right A1 widely patent. Left A1 hypoplastic with a moderate to severe short-segment stenosis at its proximal aspect (series 14, image 85). Anterior communicating artery complex irregular without definite discrete aneurysm. Anterior cerebral arteries irregular but patent distally without high-grade stenosis. M1 segments mildly irregular but patent bilaterally without flow-limiting stenosis. Normal MCA bifurcations. Diffuse small vessel atheromatous irregularity seen throughout the MCA branches bilaterally. No vascular abnormality seen underlying the large right cerebral hemorrhage. Posterior circulation: Vertebral arteries demonstrate diffuse atheromatous irregularity but are patent to the vertebrobasilar junction without high-grade stenosis. Posterior inferior cerebral arteries patent bilaterally. Basilar diminutive and irregular but patent to its distal aspect without high-grade stenosis. Superior cerebral arteries patent bilaterally. There is a probable small aneurysm arising from the basilar tip measuring approximately 3 mm (series 13, image 113). Fetal type origin of the left PCA supplied via the left posterior communicating artery. Right PCA supplied via a small right P1 segment as well as the right posterior communicating artery. PCAs grossly patent to their distal aspects without high-grade stenosis. Venous sinuses: Not well assessed due to timing of the contrast bolus. Anatomic variants: None significant. Delayed phase: No abnormal enhancement. IMPRESSION: CT HEAD IMPRESSION 1. No significant interval change in size of large intraparenchymal hematoma centered at the right basal ganglia, measuring 8.4 x 4.1 x 4.4 cm. Slightly increased localized vasogenic edema with similar regional  mass effect and 10 mm of right-to-left midline shift. 2. Associated intraventricular extension with blood primarily in the lateral and third ventricles, similar to previous. Stable ventricular size and morphology without worsening  hydrocephalus or ventricular trapping. 3. No other new acute intracranial abnormality. CTA HEAD IMPRESSION 1. No vascular abnormality seen underlying the large right cerebral hemorrhage. 2. Multiple intracranial aneurysms: 3 mm proximal cavernous left ICA aneurysm, 5 mm distal cavernous left ICA aneurysm, 3 mm supraclinoid left ICA aneurysm, 4 mm proximal cavernous right ICA aneurysm, 4 mm supraclinoid right ICA aneurysm, and 3 mm basilar tip aneurysm. 3. Underlying moderate to advanced intracranial atherosclerotic change. No proximal high-grade or correctable stenosis identified. Electronically Signed   By: Jeannine Boga M.D.   On: 02/28/2018 05:52   Ct Head Wo Contrast  Result Date: 03/05/2018 CLINICAL DATA:  53 y/o  F; follow-up of intracranial hemorrhage. EXAM: CT HEAD WITHOUT CONTRAST TECHNIQUE: Contiguous axial images were obtained from the base of the skull through the vertex without intravenous contrast. COMPARISON:  03/02/2017 CT head. FINDINGS: Brain: Hemorrhage within the right basal ganglia is stable in size measuring 7.9 x 3.9 x 4.5 cm (volume = 73 cm^3) when measured in a similar fashion (AP x ML x CC series 3, image 19 and 17 as well as series 5, image 37). Stable surrounding edema and associated mass effect with partial effacement of right lateral ventricle and 10 mm right-to-left midline shift. Stable volume of intraventricular hemorrhage within third ventricles and pooling in occipital horns of lateral ventricles. Stable ventricle size. No new stroke, hemorrhage, or focal mass effect identified. Vascular: No hyperdense vessel or unexpected calcification. Skull: Normal. Negative for fracture or focal lesion. Sinuses/Orbits: Debris within the nasopharynx and  diffuse opacification of the paranasal sinuses as well as left-greater-than-right mastoid air cells, likely due to nasoenteric tube and intubation. Normal orbits. Other: None. IMPRESSION: 1. Stable hemorrhage in the right basal ganglia measuring up to 7.9 cm, 73 cc, when measured in a similar fashion. 2. Stable associated edema and mass effect with 10 mm right-to-left midline shift. 3. Stable small volume intraventricular hemorrhage. Stable ventricle size. 4. No new acute intracranial abnormality identified. Electronically Signed   By: Kristine Garbe M.D.   On: 03/05/2018 06:40   Ct Head Wo Contrast  Result Date: 03/02/2018 CLINICAL DATA:  Follow-up examination for intracranial hemorrhage. EXAM: CT HEAD WITHOUT CONTRAST TECHNIQUE: Contiguous axial images were obtained from the base of the skull through the vertex without intravenous contrast. COMPARISON:  Prior CT from 02/28/2018 FINDINGS: Brain: Large intraparenchymal hemorrhage centered at the right basal ganglia measures 7.9 x 3.9 x 4.5 cm on today's study, slightly decreased from previous. Surrounding low-density vasogenic edema with regional mass effect relatively similar there remains up to approximately 10 mm right-to-left shift, unchanged. Intraventricular extension with blood in the lateral and third ventricles, not significantly changed in volume. Stable ventricular size and morphology without worsening hydrocephalus or ventricular trapping. No other new acute intracranial hemorrhage. No acute large vessel territory infarct. No extra-axial collection. Vascular: No hyperdense vessel. Skull: Scalp soft tissues and calvarium demonstrate no acute finding. Sinuses/Orbits: Globes and orbital soft tissues within normal limits. Moderate mucosal thickening throughout the paranasal sinuses with associated air-fluid levels. Fluid within the nasopharynx. Patient likely intubated. Small bilateral mastoid effusions noted. Other: None. IMPRESSION: 1. Slight  interval decrease in size of large intraparenchymal hemorrhage centered at the right basal ganglia, now measuring 7.9 x 3.9 x 4.5 cm on today's study. 2. Similar regional mass effect with up to 10 mm right-to-left shift. 3. Similar intraventricular extension with blood in the lateral and third ventricles. Overall ventricular size and morphology relatively stable without worsening hydrocephalus or ventricular trapping. 4.  No other new acute intracranial abnormality. Electronically Signed   By: Jeannine Boga M.D.   On: 03/02/2018 05:10   Ct Head Wo Contrast  Result Date: 02/27/2018 CLINICAL DATA:  Follow-up examination for intracranial hemorrhage. EXAM: CT HEAD WITHOUT CONTRAST TECHNIQUE: Contiguous axial images were obtained from the base of the skull through the vertex without intravenous contrast. COMPARISON:  Prior CT from 02/24/2018. FINDINGS: Brain: Large intraparenchymal hemorrhage centered at the right basal ganglia again seen, measuring 8.6 x 3.8 x 4.4 cm (estimated volume 71.9 cc, previously approximately 75 cc). Surrounding low-density vasogenic edema with regional mass effect with up to 10 mm of right-to-left midline shift, relatively similar to previous. Partial basilar cistern effacement similar to previous. Intraventricular extension with blood primarily in the lateral and third ventricles, also similar to previous. Stable ventricular size and morphology without worsening hydrocephalus or ventricular trapping. Remainder of the brain is stable in appearance. No new intracranial hemorrhage. No other acute large vessel territory infarct. No extra-axial collection. Vascular: No asymmetric hyperdense vessel. Skull: Small evolving left parietotemporal scalp contusion calvarium intact. Sinuses/Orbits: Globes and orbital soft tissues demonstrate no acute finding. Scattered mucosal thickening with air-fluid levels noted within the paranasal sinuses. Patient likely intubated. Mastoid air cells remain  clear. Other: None. IMPRESSION: 1. Similar to slightly decreased size of large intraparenchymal hematoma centered at the right basal ganglia, estimated volume 71.9 cc on current exam, previously 75 cc. 2. Similar regional mass effect with up to 10 mm of right-to-left midline shift. 3. Associated intraventricular extension with blood primarily in the lateral and third ventricles, similar to previous. 4. No other new intracranial abnormality. Electronically Signed   By: Jeannine Boga M.D.   On: 02/27/2018 02:59   Ct Head Wo Contrast  Result Date: 03/08/2018 CLINICAL DATA:  53 y/o F; altered level of consciousness. Found unresponsive in a bathtub. EXAM: CT HEAD WITHOUT CONTRAST CT CERVICAL SPINE WITHOUT CONTRAST TECHNIQUE: Multidetector CT imaging of the head and cervical spine was performed following the standard protocol without intravenous contrast. Multiplanar CT image reconstructions of the cervical spine were also generated. COMPARISON:  01/09/2016 cervical spine radiographs FINDINGS: CT HEAD FINDINGS Brain: Acute hemorrhage centered in the right basal ganglia with extension into the right cerebral hemisphere measuring 8.4 x 3.9 x 4.6 cm (volume = 79 cm^3) (AP x ML x CC series 4, image 24 and series 6, image 38). There is extension of the hematoma into the intraventricular system, predominantly the right lateral ventricle. Associated edema and mass effect results in 10 mm of right-to-left midline shift, right-sided uncal herniation, and partial effacement of the basilar cisterns. No transtentorial or foramen magnum herniation. Vascular: No hyperdense vessel or unexpected calcification. Skull: Normal. Negative for fracture or focal lesion. Sinuses/Orbits: Small paranasal sinus fluid levels. Normal aeration of mastoid air cells. Orbits are unremarkable. Other: None. CT CERVICAL SPINE FINDINGS Alignment: Normal. Skull base and vertebrae: No acute fracture. No primary bone lesion or focal pathologic  process. Soft tissues and spinal canal: No prevertebral fluid or swelling. No visible canal hematoma. Disc levels: Mild spondylosis of the cervical spine with predominantly discogenic degenerative changes greatest at the C5-C7 levels. Upper chest: Negative. Other: 15 mm nodule within the left lobe of the thyroid gland. IMPRESSION: CT head: 1. Acute hemorrhage centered in right basal ganglia with extension into right cerebral hemisphere measuring up to 8.4 cm, 79 CC. 2. Extension of hemorrhage into the intraventricular system, greatest in the right lateral ventricle. 3. Mass effect with 10 mm right-to-left  midline shift, right-sided uncal herniation, and partial effacement of basilar cisterns. No downward herniation at this time. CT cervical spine. 1. No acute fracture or dislocation of the cervical spine. 2. 15 mm nodule within left lobe of thyroid gland. Further evaluation with thyroid ultrasound is recommended on a nonemergent basis. 3. Mild spondylosis of the cervical spine. Critical Value/emergent results were called by telephone at the time of interpretation on 02/25/2018 at 5:55 pm to Dr. Nanda Quinton , who verbally acknowledged these results. Electronically Signed   By: Kristine Garbe M.D.   On: 03/08/2018 18:00   Ct Cervical Spine Wo Contrast  Result Date: 03/05/2018 CLINICAL DATA:  53 y/o F; altered level of consciousness. Found unresponsive in a bathtub. EXAM: CT HEAD WITHOUT CONTRAST CT CERVICAL SPINE WITHOUT CONTRAST TECHNIQUE: Multidetector CT imaging of the head and cervical spine was performed following the standard protocol without intravenous contrast. Multiplanar CT image reconstructions of the cervical spine were also generated. COMPARISON:  01/09/2016 cervical spine radiographs FINDINGS: CT HEAD FINDINGS Brain: Acute hemorrhage centered in the right basal ganglia with extension into the right cerebral hemisphere measuring 8.4 x 3.9 x 4.6 cm (volume = 79 cm^3) (AP x ML x CC series 4,  image 24 and series 6, image 38). There is extension of the hematoma into the intraventricular system, predominantly the right lateral ventricle. Associated edema and mass effect results in 10 mm of right-to-left midline shift, right-sided uncal herniation, and partial effacement of the basilar cisterns. No transtentorial or foramen magnum herniation. Vascular: No hyperdense vessel or unexpected calcification. Skull: Normal. Negative for fracture or focal lesion. Sinuses/Orbits: Small paranasal sinus fluid levels. Normal aeration of mastoid air cells. Orbits are unremarkable. Other: None. CT CERVICAL SPINE FINDINGS Alignment: Normal. Skull base and vertebrae: No acute fracture. No primary bone lesion or focal pathologic process. Soft tissues and spinal canal: No prevertebral fluid or swelling. No visible canal hematoma. Disc levels: Mild spondylosis of the cervical spine with predominantly discogenic degenerative changes greatest at the C5-C7 levels. Upper chest: Negative. Other: 15 mm nodule within the left lobe of the thyroid gland. IMPRESSION: CT head: 1. Acute hemorrhage centered in right basal ganglia with extension into right cerebral hemisphere measuring up to 8.4 cm, 79 CC. 2. Extension of hemorrhage into the intraventricular system, greatest in the right lateral ventricle. 3. Mass effect with 10 mm right-to-left midline shift, right-sided uncal herniation, and partial effacement of basilar cisterns. No downward herniation at this time. CT cervical spine. 1. No acute fracture or dislocation of the cervical spine. 2. 15 mm nodule within left lobe of thyroid gland. Further evaluation with thyroid ultrasound is recommended on a nonemergent basis. 3. Mild spondylosis of the cervical spine. Critical Value/emergent results were called by telephone at the time of interpretation on 03/08/2018 at 5:55 pm to Dr. Nanda Quinton , who verbally acknowledged these results. Electronically Signed   By: Kristine Garbe  M.D.   On: 03/22/2018 18:00   Dg Chest Port 1 View  Result Date: 03/06/2018 CLINICAL DATA:  Pneumonia EXAM: PORTABLE CHEST 1 VIEW COMPARISON:  Chest radiograph 03/05/2018 FINDINGS: Right upper extremity PICC line tip projects over the right atrium. ET tube terminates in the distal trachea. Enteric tube courses inferior to the diaphragm. Monitoring leads overlie the patient. Stable cardiomegaly. Interval improved aeration of the left lung base. Persistent right lung base opacities. Probable small left pleural effusion. IMPRESSION: Support apparatus as above. Improved aeration of the left mid lower lung with persistent small left pleural  effusion. Electronically Signed   By: Lovey Newcomer M.D.   On: 03/06/2018 08:13   Dg Chest Port 1 View  Result Date: 03/05/2018 CLINICAL DATA:  Respiratory failure. EXAM: PORTABLE CHEST 1 VIEW COMPARISON:  Radiograph of March 04, 2018. FINDINGS: Stable cardiomegaly. Endotracheal and feeding tubes are unchanged in position. Right-sided PICC line is unchanged. No pneumothorax is noted. Stable bibasilar opacities are noted, left greater than right. Small left pleural effusion can not be excluded. Bony thorax is unremarkable. IMPRESSION: Stable support apparatus. Stable bibasilar opacities as described above, left greater than right. Electronically Signed   By: Marijo Conception, M.D.   On: 03/05/2018 08:36   Dg Chest Port 1 View  Result Date: 03/04/2018 CLINICAL DATA:  Seizures EXAM: PORTABLE CHEST 1 VIEW COMPARISON:  03/03/2018 FINDINGS: Endotracheal tube, feeding tube, right PICC are stable. Cardiomegaly. Low lung volumes. Bibasilar atelectasis. No pneumothorax. No definitive pleural effusion. IMPRESSION: Stable cardiomegaly and bibasilar atelectasis. Electronically Signed   By: Marybelle Killings M.D.   On: 03/04/2018 08:48   Dg Chest Port 1 View  Result Date: 03/03/2018 CLINICAL DATA:  Shortness of breath.  Oxygen desaturation. EXAM: PORTABLE CHEST 1 VIEW COMPARISON:   Radiograph 03/01/2018 FINDINGS: Endotracheal tube tip 3 cm from the carina. Weighted enteric tube tip below the diaphragm, not included in the field of view. Right upper extremity central venous catheter tip in the distal SVC. Improving right lung base aeration with persistent but improving opacity. Prior pleural effusion is not as well seen. Improved retrocardiac opacity from prior. Unchanged heart size and mediastinal contours. No pulmonary edema or pneumothorax. IMPRESSION: 1. Improving right basilar aeration with persistent but improving opacity. The prior pleural effusion is not as well seen. Improving retrocardiac atelectasis. 2. Otherwise unchanged exam. Endotracheal tube and right central line in place. Weighted enteric tube tip is below the diaphragm, not included in the field of view. Electronically Signed   By: Keith Rake M.D.   On: 03/03/2018 03:23   Dg Chest Port 1 View  Result Date: 03/01/2018 CLINICAL DATA:  Acute respiratory failure. EXAM: PORTABLE CHEST 1 VIEW COMPARISON:  02/28/2018. FINDINGS: Endotracheal tube and NG tube in stable position. Right PICC line stable position with tip over upper right atrium. Stable cardiomegaly. Progressive atelectasis/infiltrate right lung base. New small right pleural effusion. No pneumothorax. IMPRESSION: 1.  Lines and tubes in unchanged position. 2. Progressive right base atelectasis/infiltrate. New small right pleural effusion. Electronically Signed   By: Marcello Moores  Register   On: 03/01/2018 06:37   Dg Chest Port 1 View  Result Date: 02/28/2018 CLINICAL DATA:  Endotracheal tube and seizures EXAM: PORTABLE CHEST 1 VIEW COMPARISON:  03/14/2018 FINDINGS: Right PICC placed. Tip is at the cavoatrial junction. Endotracheal and NG tubes are stable. Upper normal heart size allowing for AP technique. Lungs are under aerated with worsening bibasilar atelectasis. IMPRESSION: Right PICC placed.  Tip is at the cavoatrial junction. Worsening bibasilar atelectasis.  Electronically Signed   By: Marybelle Killings M.D.   On: 02/28/2018 07:48   Dg Chest Portable 1 View  Result Date: 02/25/2018 CLINICAL DATA:  Endotracheal tube adjustment. EXAM: PORTABLE CHEST 1 VIEW COMPARISON:  03/03/2018 at 1805 hours FINDINGS: Since the earlier exam, the endotracheal tube has been retracted. Tip now projects 1 cm above the Carina. Nasal/orogastric tube is well positioned passing below the diaphragm into the mid stomach. There is significant improvement in lung aeration on the left following endotracheal tube adjustment. There is persistent opacity at the left lung base  consistent with pneumonia or atelectasis. Right lung remains clear. IMPRESSION: 1. Endotracheal tube is now well-positioned with subsequent significant improvement in left lung aeration. Electronically Signed   By: Lajean Manes M.D.   On: 03/25/2018 19:14   Dg Chest Portable 1 View  Addendum Date: 03/20/2018   ADDENDUM REPORT: 03/08/2018 18:43 ADDENDUM: The original report was by Dr. Van Clines. The following addendum is by Dr. Van Clines: Critical Value/emergent results were called by telephone at the time of interpretation on 03/16/2018 at 6:43 pm to Dr. Nanda Quinton , who verbally acknowledged these results. Electronically Signed   By: Van Clines M.D.   On: 03/07/2018 18:43   Result Date: 03/17/2018 CLINICAL DATA:  Orogastric tube placement EXAM: PORTABLE CHEST 1 VIEW COMPARISON:  03/21/2018 at 5:09 p.m. FINDINGS: The endotracheal tube is just into the right mainstem bronchus. There is interval atelectasis of most of the left lung. Retraction of 5 cm is recommended. An orogastric tube enters the stomach. The right lung appears clear.  Left heart border obscured. IMPRESSION: 1. Right mainstem bronchus intubation, with interval atelectasis of much of the left lung. I recommend retracting the endotracheal tube 5 cm and obtaining a repeat chest radiograph. Radiology assistant personnel have been notified to  put me in telephone contact with the referring physician or the referring physician's clinical representative in order to discuss these findings. Once this communication is established I will issue an addendum to this report for documentation purposes. Electronically Signed: By: Van Clines M.D. On: 02/25/2018 18:38   Dg Chest Portable 1 View  Result Date: 03/14/2018 CLINICAL DATA:  Per EMS- pt here from home, was found in a bathtub face up. She was unresponsive and required assisted ventilations with a BVM. Pt now on non rebreather. Hx of ETOH abuse and seizures. No hx of drug abuse. Hx of HTN. Former smoker EXAM: PORTABLE CHEST 1 VIEW COMPARISON:  None. FINDINGS: Cardiac silhouette is normal in size. No mediastinal or hilar masses. No evidence of adenopathy. Lungs are clear. No gross pleural effusion or pneumothorax on this supine study. Skeletal structures are grossly intact. IMPRESSION: No active disease. Electronically Signed   By: Lajean Manes M.D.   On: 03/03/2018 17:20   Dg Abd Portable 1v  Result Date: 03/16/2018 CLINICAL DATA:  Orogastric tube placement. EXAM: PORTABLE ABDOMEN - 1 VIEW COMPARISON:  None. FINDINGS: Nasogastric tube tip projects in mid stomach. The included bowel gas pattern is normal. No radio-opaque calculi or other significant radiographic abnormality are seen. Dense LEFT lung consolidation. IMPRESSION: Nasogastric tube tip projects in mid stomach. Electronically Signed   By: Elon Alas M.D.   On: 02/24/2018 18:36   [place] Ekg Site Rite  Result Date: 02/27/2018 If Site Rite image not attached, placement could not be confirmed due to current cardiac rhythm.   Microbiology Recent Results (from the past 240 hour(s))  Culture, respiratory (non-expectorated)     Status: None   Collection Time: 03/01/18 11:23 AM  Result Value Ref Range Status   Specimen Description TRACHEAL ASPIRATE  Final   Special Requests NONE  Final   Gram Stain   Final    MODERATE WBC  PRESENT, PREDOMINANTLY PMN MODERATE GRAM POSITIVE COCCI Performed at [facility], [address], [place], [place] [address]    Culture ABUNDANT STAPHYLOCOCCUS AUREUS  Final   Report Status 03/03/2018 FINAL  Final   Organism ID, Bacteria STAPHYLOCOCCUS AUREUS  Final      Susceptibility   Staphylococcus aureus -  MIC*    CIPROFLOXACIN <=0.5 SENSITIVE Sensitive     ERYTHROMYCIN >=8 RESISTANT Resistant     GENTAMICIN <=0.5 SENSITIVE Sensitive     OXACILLIN 0.5 SENSITIVE Sensitive     TETRACYCLINE <=1 SENSITIVE Sensitive     VANCOMYCIN <=0.5 SENSITIVE Sensitive     TRIMETH/SULFA <=10 SENSITIVE Sensitive     CLINDAMYCIN RESISTANT Resistant     RIFAMPIN <=0.5 SENSITIVE Sensitive     Inducible Clindamycin POSITIVE Resistant     * ABUNDANT STAPHYLOCOCCUS AUREUS  Culture, blood (Routine X 2) w Reflex to ID Panel     Status: None   Collection Time: 03/03/18 10:56 AM  Result Value Ref Range Status   Specimen Description BLOOD LEFT HAND  Final   Special Requests   Final    BOTTLES DRAWN AEROBIC AND ANAEROBIC Blood Culture adequate volume   Culture   Final    NO GROWTH 5 DAYS Performed at [facility], [address] [place], [place] [address]    Report Status 03/08/2018 FINAL  Final  Culture, blood (Routine X 2) w Reflex to ID Panel     Status: None   Collection Time: 03/03/18 11:07 AM  Result Value Ref Range Status   Specimen Description BLOOD LEFT HAND  Final   Special Requests   Final    BOTTLES DRAWN AEROBIC AND ANAEROBIC Blood Culture results may not be optimal due to an excessive volume of blood received in culture bottles   Culture   Final    NO GROWTH 5 DAYS Performed at [facility], [place] [address], [place], [place] [address]    Report Status 03/08/2018 FINAL  Final    Lab Basic Metabolic Panel: Recent Labs  Lab 03/04/18 0600  03/04/18 1527  03/05/18 0510  03/06/18 0446 03/06/18 1255 03/06/18 1834 03/07/18 0015 03/07/18 0452 03/08/18 0500  NA  153*   < > 156*   < > 157*   < > 153* 155* 155* 155* 154* 155*  K 3.2*  --  3.0*  --  3.1*  --  3.1*  --   --   --  3.3* 3.4*  CL 120*  --  121*  --  121*  --  119*  --   --   --  121* 125*  CO2 25  --  27  --  25  --  24  --   --   --  25 23  GLUCOSE 129*  --  128*  --  128*  --  189*  --   --   --  153* 201*  BUN 18  --  20  --  20  --  35*  --   --   --  34* 41*  CREATININE 0.83  --  0.77  --  0.87  --  1.28*  --   --   --  1.15* 1.22*  CALCIUM 9.0  --  8.7*  --  9.2  --  8.9  --   --   --  8.8* 8.5*  MG 2.4  --   --   --  2.4  --   --   --   --   --   --   --   PHOS 2.2*  --   --   --  3.0  --   --   --   --   --   --   --    < > = values in  this interval not displayed.   Liver Function Tests: Recent Labs  Lab 03/06/18 0446 03/08/18 0500  AST 32 25  ALT 60* 44  ALKPHOS 86 64  BILITOT 1.4* 0.9  PROT 6.5 5.7*  ALBUMIN 2.7* 2.4*   No results for input(s): LIPASE, AMYLASE in the last 168 hours. No results for input(s): AMMONIA in the last 168 hours. CBC: Recent Labs  Lab 03/04/18 0600 03/05/18 0510 03/06/18 0446 03/07/18 0452 03/08/18 0500  WBC 14.2* 12.7* 12.9* 11.2* 9.8  NEUTROABS 9.5* 7.6  --   --   --   HGB 13.1 12.8 12.1 11.6* 10.4*  HCT 41.6 40.8 40.1 37.5 34.3*  MCV 93.9 96.2 96.4 97.7 100.0  PLT 263 261 264 266 218   Cardiac Enzymes: No results for input(s): CKTOTAL, CKMB, CKMBINDEX, TROPONINI in the last 168 hours. Sepsis Labs: Recent Labs  Lab 03/03/18 1017  03/05/18 0510 03/06/18 0446 03/07/18 0452 03/08/18 0500  PROCALCITON <0.10  --   --   --   --   --   WBC  --    < > 12.7* 12.9* 11.2* 9.8   < > = values in this interval not displayed.    Procedures/Operations  Intubation   Rosalin Hawking 04-04-18, 12:21 PM

## 2018-03-26 NOTE — Progress Notes (Signed)
Not ME case per Izora Ribas, ME on call.

## 2018-03-26 NOTE — Progress Notes (Signed)
Patient time of death 66. Pronounced by [place] Paticia Moster, RN and Matilde Haymaker, RN.  Mother and sister at bedside at time of death.  Referred to [facility], possible tissue donor. Death certificate signed and will be sent with patient to morgue.  271ml fentanyl wasted in sink.

## 2018-03-26 NOTE — Progress Notes (Signed)
On Call Note Called by patient RN to notify of patient's passing at 0413 hrs.  Death certificate signed.   -- Amie Portland, MD

## 2018-03-26 DEATH — deceased

## 2020-07-06 IMAGING — CT CT HEAD W/O CM
4 series · 15 of 47 positions shown, 17 images · non-contrast
Comparison: 03/02/2017 CT head.

CLINICAL DATA: 52 y/o  F; follow-up of intracranial hemorrhage.

EXAM:
CT HEAD WITHOUT CONTRAST
TECHNIQUE: Contiguous axial images were obtained from the base of the skull
through the vertex without intravenous contrast.

[Series 3: head wo · axial · 0.46mm/px · z∈[-144,-24]mm · 7 of 34 slices shown, 9 images]
[im 5/34  brain]
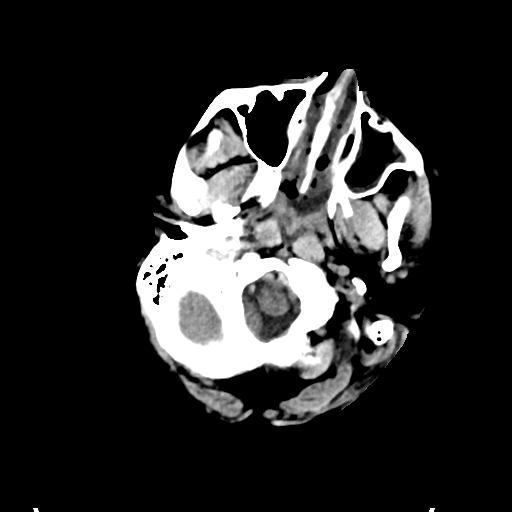
[im 5/34  bone]
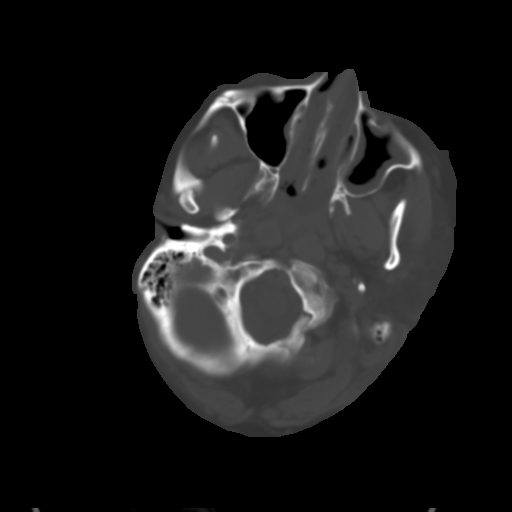
[im 9/34  brain]
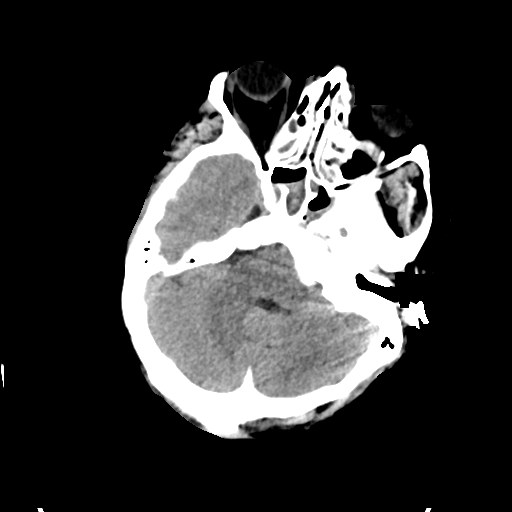
[im 13/34  brain]
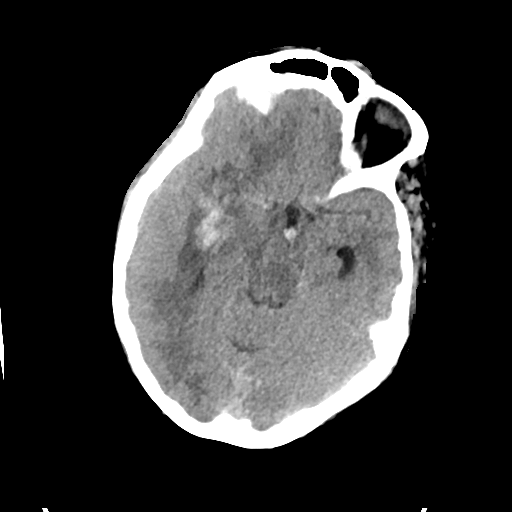
[im 17/34  brain]
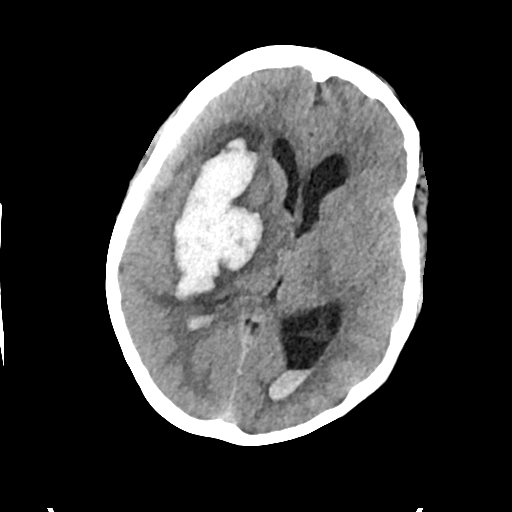
[im 21/34  brain]
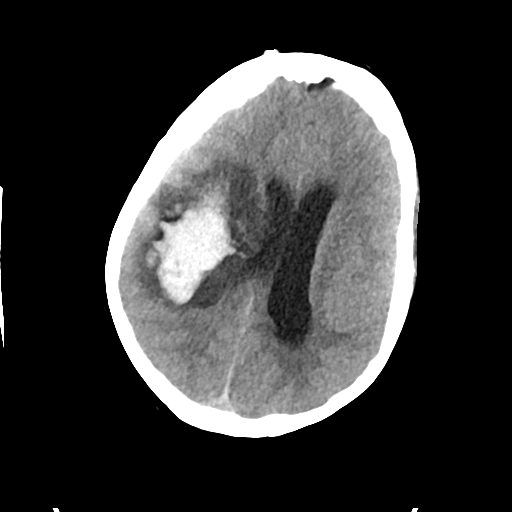
[im 21/34  bone]
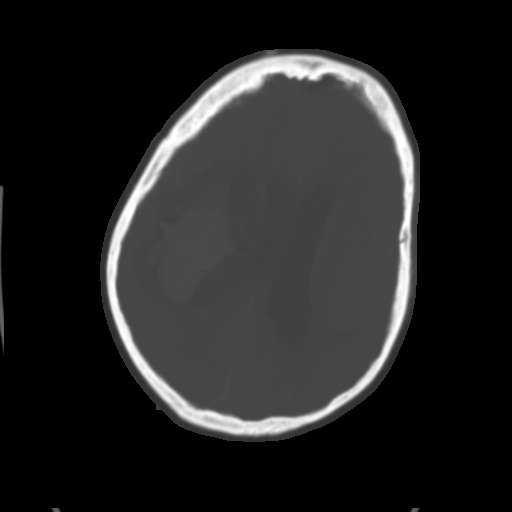
[im 25/34  brain]
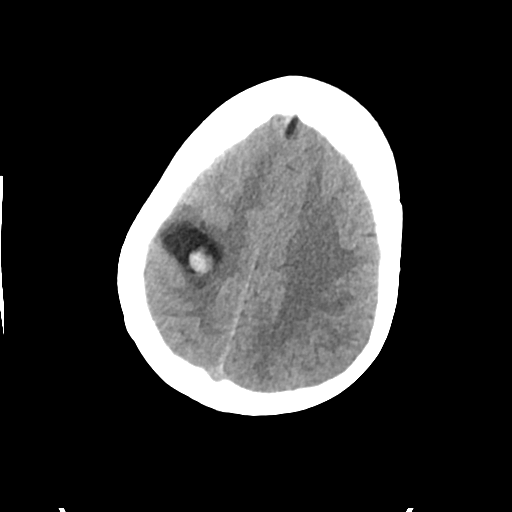
[im 29/34  brain]
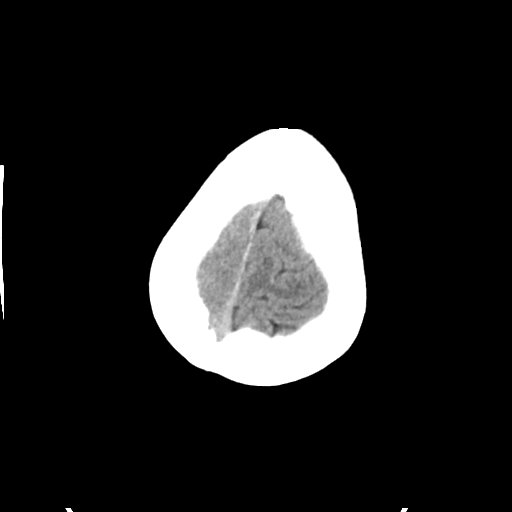

[Series 4: head bone · axial · 0.46mm/px · z∈[-148,-132]mm · 2 of 85 slices shown]
[im 9/85  bone]
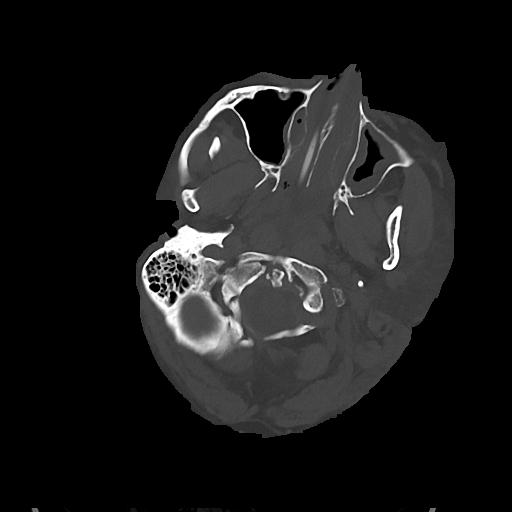
[im 17/85  bone]
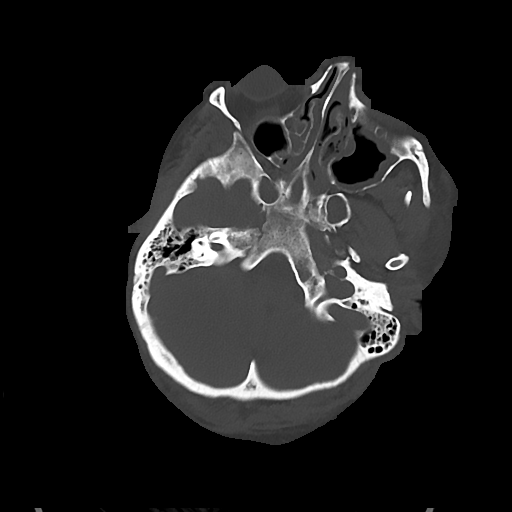

[Series 5: cor soft · coronal · 0.33mm/px · 3 of 74 slices shown]
[im 25/74  brain]
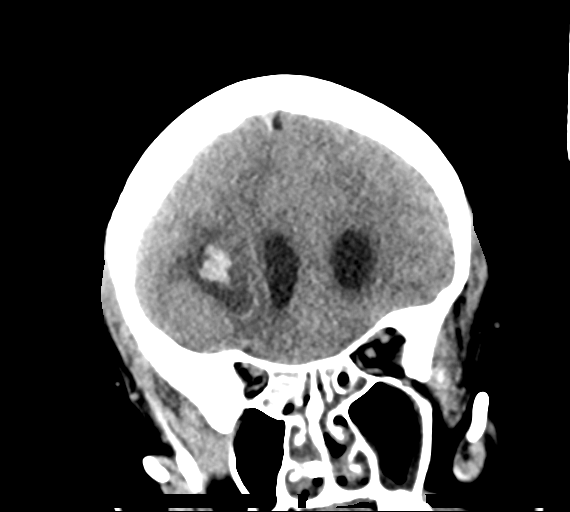
[im 33/74  brain]
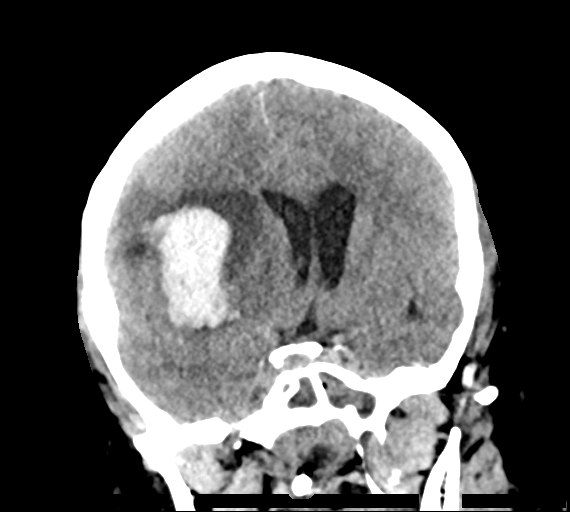
[im 41/74  brain]
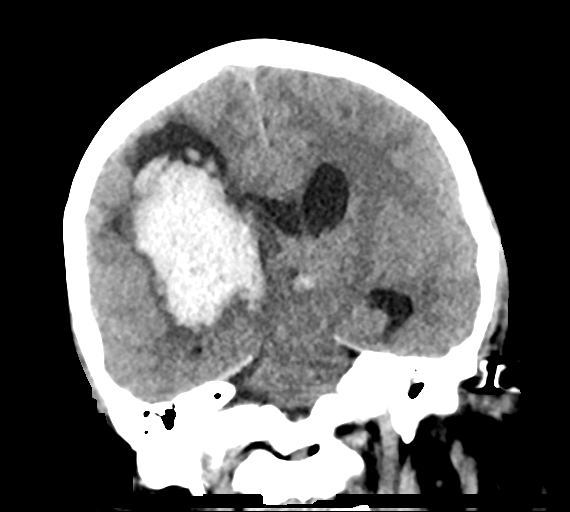

[Series 6: sag soft · sagittal · 0.33mm/px · 3 of 67 slices shown]
[im 23/67  brain]
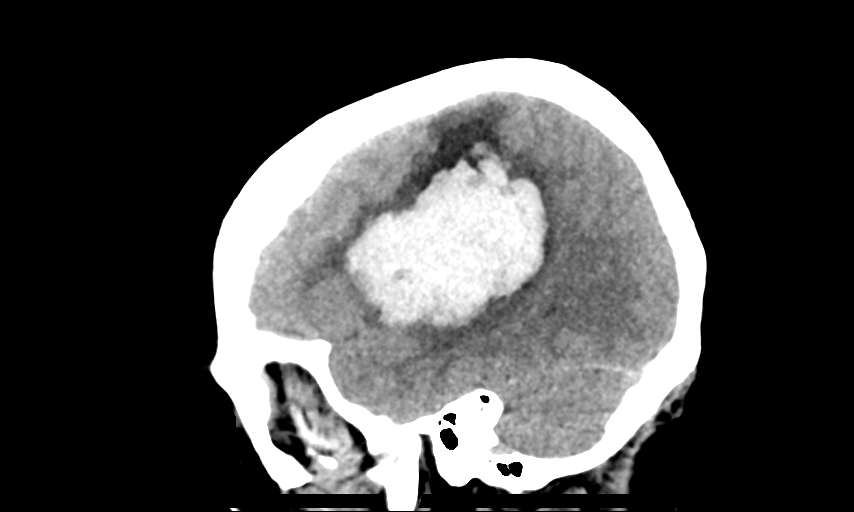
[im 34/67  brain]
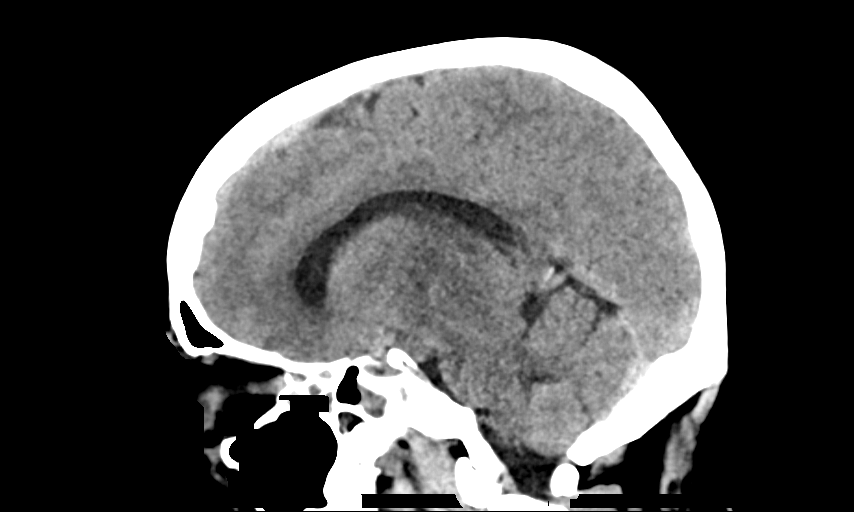
[im 45/67  brain]
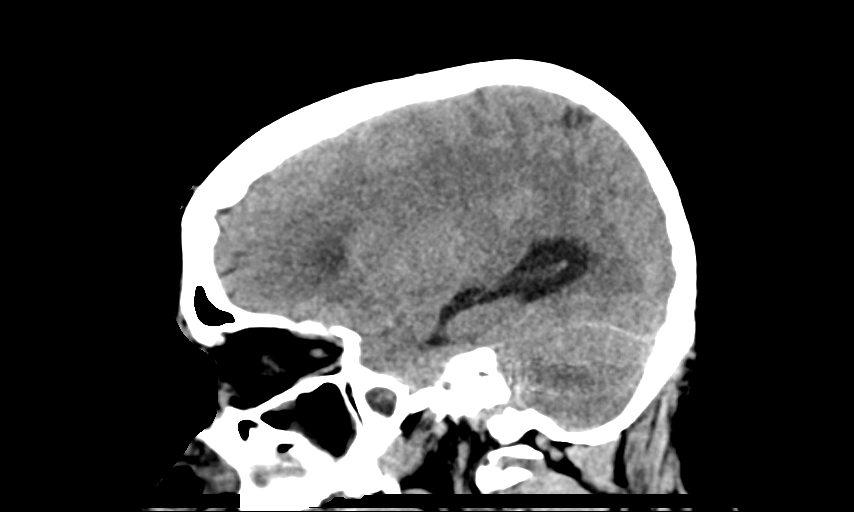

[15 of 47 positions shown; findings below may reference images not displayed]

FINDINGS: Brain: Hemorrhage within the right basal ganglia is stable in size
measuring 7.9 x 3.9 x 4.5 cm (volume = 73 cm^3) when measured in a
similar fashion (AP x ML x CC series 3, image 19 and 17 as well as
series 5, image 37). Stable surrounding edema and associated mass
effect with partial effacement of right lateral ventricle and 10 mm
right-to-left midline shift. Stable volume of intraventricular
hemorrhage within third ventricles and pooling in occipital horns of
lateral ventricles. Stable ventricle size. No new stroke,
hemorrhage, or focal mass effect identified.

Vascular: No hyperdense vessel or unexpected calcification.

Skull: Normal. Negative for fracture or focal lesion.

Sinuses/Orbits: Debris within the nasopharynx and diffuse
opacification of the paranasal sinuses as well as
left-greater-than-right mastoid air cells, likely due to nasoenteric
tube and intubation. Normal orbits.

Other: None.
IMPRESSION: 1. Stable hemorrhage in the right basal ganglia measuring up to
cm, 73 cc, when measured in a similar fashion.
2. Stable associated edema and mass effect with 10 mm right-to-left
midline shift.
3. Stable small volume intraventricular hemorrhage. Stable ventricle
size.
4. No new acute intracranial abnormality identified.
